# Patient Record
Sex: Female | Born: 1955
Health system: Southern US, Community
[De-identification: ages and names within clinical notes are randomized; demographics above are authoritative.]

## PROBLEM LIST (undated history)

## (undated) DIAGNOSIS — Z8619 Personal history of other infectious and parasitic diseases: Secondary | ICD-10-CM

## (undated) DIAGNOSIS — Z800 Family history of malignant neoplasm of digestive organs: Secondary | ICD-10-CM

## (undated) DIAGNOSIS — K219 Gastro-esophageal reflux disease without esophagitis: Secondary | ICD-10-CM

## (undated) DIAGNOSIS — I10 Essential (primary) hypertension: Secondary | ICD-10-CM

## (undated) DIAGNOSIS — Z803 Family history of malignant neoplasm of breast: Secondary | ICD-10-CM

## (undated) DIAGNOSIS — Z87828 Personal history of other (healed) physical injury and trauma: Secondary | ICD-10-CM

## (undated) DIAGNOSIS — R011 Cardiac murmur, unspecified: Secondary | ICD-10-CM

## (undated) DIAGNOSIS — T7840XA Allergy, unspecified, initial encounter: Secondary | ICD-10-CM

## (undated) HISTORY — PX: WISDOM TOOTH EXTRACTION: SHX21

## (undated) HISTORY — DX: Personal history of other (healed) physical injury and trauma: Z87.828

## (undated) HISTORY — PX: POLYPECTOMY: SHX149

## (undated) HISTORY — DX: Personal history of other infectious and parasitic diseases: Z86.19

## (undated) HISTORY — DX: Allergy, unspecified, initial encounter: T78.40XA

## (undated) HISTORY — DX: Gastro-esophageal reflux disease without esophagitis: K21.9

## (undated) HISTORY — DX: Essential (primary) hypertension: I10

## (undated) HISTORY — DX: Family history of malignant neoplasm of digestive organs: Z80.0

## (undated) HISTORY — DX: Family history of malignant neoplasm of breast: Z80.3

## (undated) HISTORY — PX: COLONOSCOPY: SHX174

## (undated) HISTORY — PX: BREAST SURGERY: SHX581

## (undated) HISTORY — DX: Cardiac murmur, unspecified: R01.1

---
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
## 1998-10-16 ENCOUNTER — Other Ambulatory Visit: Admission: RE | Admit: 1998-10-16 | Discharge: 1998-10-16 | Payer: Self-pay | Admitting: Gynecology

## 2001-07-06 ENCOUNTER — Encounter: Admission: RE | Admit: 2001-07-06 | Discharge: 2001-07-06 | Payer: Self-pay | Admitting: Gynecology

## 2001-07-06 ENCOUNTER — Encounter: Payer: Self-pay | Admitting: Gynecology

## 2001-07-10 ENCOUNTER — Other Ambulatory Visit: Admission: RE | Admit: 2001-07-10 | Discharge: 2001-07-10 | Payer: Self-pay | Admitting: Gynecology

## 2002-07-18 ENCOUNTER — Other Ambulatory Visit: Admission: RE | Admit: 2002-07-18 | Discharge: 2002-07-18 | Payer: Self-pay | Admitting: Gynecology

## 2002-07-19 ENCOUNTER — Encounter: Payer: Self-pay | Admitting: Gynecology

## 2002-07-19 ENCOUNTER — Ambulatory Visit (HOSPITAL_COMMUNITY): Admission: RE | Admit: 2002-07-19 | Discharge: 2002-07-19 | Payer: Self-pay | Admitting: Gynecology

## 2003-10-05 HISTORY — PX: KNEE ARTHROSCOPY WITH ANTERIOR CRUCIATE LIGAMENT (ACL) REPAIR: SHX5644

## 2004-11-16 ENCOUNTER — Ambulatory Visit (HOSPITAL_COMMUNITY): Admission: RE | Admit: 2004-11-16 | Discharge: 2004-11-16 | Payer: Self-pay | Admitting: Gynecology

## 2005-03-31 ENCOUNTER — Ambulatory Visit (HOSPITAL_COMMUNITY): Admission: RE | Admit: 2005-03-31 | Discharge: 2005-03-31 | Payer: Self-pay | Admitting: Orthopedic Surgery

## 2005-04-19 ENCOUNTER — Ambulatory Visit (HOSPITAL_BASED_OUTPATIENT_CLINIC_OR_DEPARTMENT_OTHER): Admission: RE | Admit: 2005-04-19 | Discharge: 2005-04-20 | Payer: Self-pay | Admitting: Orthopedic Surgery

## 2005-04-19 ENCOUNTER — Ambulatory Visit (HOSPITAL_COMMUNITY): Admission: RE | Admit: 2005-04-19 | Discharge: 2005-04-19 | Payer: Self-pay | Admitting: Orthopedic Surgery

## 2006-01-28 ENCOUNTER — Ambulatory Visit (HOSPITAL_COMMUNITY): Admission: RE | Admit: 2006-01-28 | Discharge: 2006-01-28 | Payer: Self-pay | Admitting: Gynecology

## 2006-02-02 ENCOUNTER — Encounter: Admission: RE | Admit: 2006-02-02 | Discharge: 2006-02-02 | Payer: Self-pay | Admitting: Gynecology

## 2006-02-17 ENCOUNTER — Encounter (INDEPENDENT_AMBULATORY_CARE_PROVIDER_SITE_OTHER): Payer: Self-pay | Admitting: *Deleted

## 2006-02-17 ENCOUNTER — Encounter: Admission: RE | Admit: 2006-02-17 | Discharge: 2006-02-17 | Payer: Self-pay | Admitting: Gynecology

## 2006-04-04 ENCOUNTER — Encounter: Admission: RE | Admit: 2006-04-04 | Discharge: 2006-04-04 | Payer: Self-pay | Admitting: Surgery

## 2006-04-04 ENCOUNTER — Ambulatory Visit (HOSPITAL_BASED_OUTPATIENT_CLINIC_OR_DEPARTMENT_OTHER): Admission: RE | Admit: 2006-04-04 | Discharge: 2006-04-04 | Payer: Self-pay | Admitting: Surgery

## 2006-04-04 ENCOUNTER — Encounter (INDEPENDENT_AMBULATORY_CARE_PROVIDER_SITE_OTHER): Payer: Self-pay | Admitting: Specialist

## 2006-08-22 ENCOUNTER — Other Ambulatory Visit: Admission: RE | Admit: 2006-08-22 | Discharge: 2006-08-22 | Payer: Self-pay | Admitting: Gynecology

## 2006-10-04 HISTORY — PX: BREAST EXCISIONAL BIOPSY: SUR124

## 2006-11-09 IMAGING — MG MM BREAST STEREO BIOPSY*L*
3 series · 3 of 3 positions shown · non-contrast
Comparison: none

MAMMO BREAST SURGICAL SPECIMEN

STEREO BIOPSY *L* BREAST
STEREOTACTIC BIOPSY, LEFT AND SURGICAL SPECIMEN:
CLINICAL DATA: 49-year-old with calcifications left breast.  The patient presents for biopsy.

[L CC (1 of 2)]
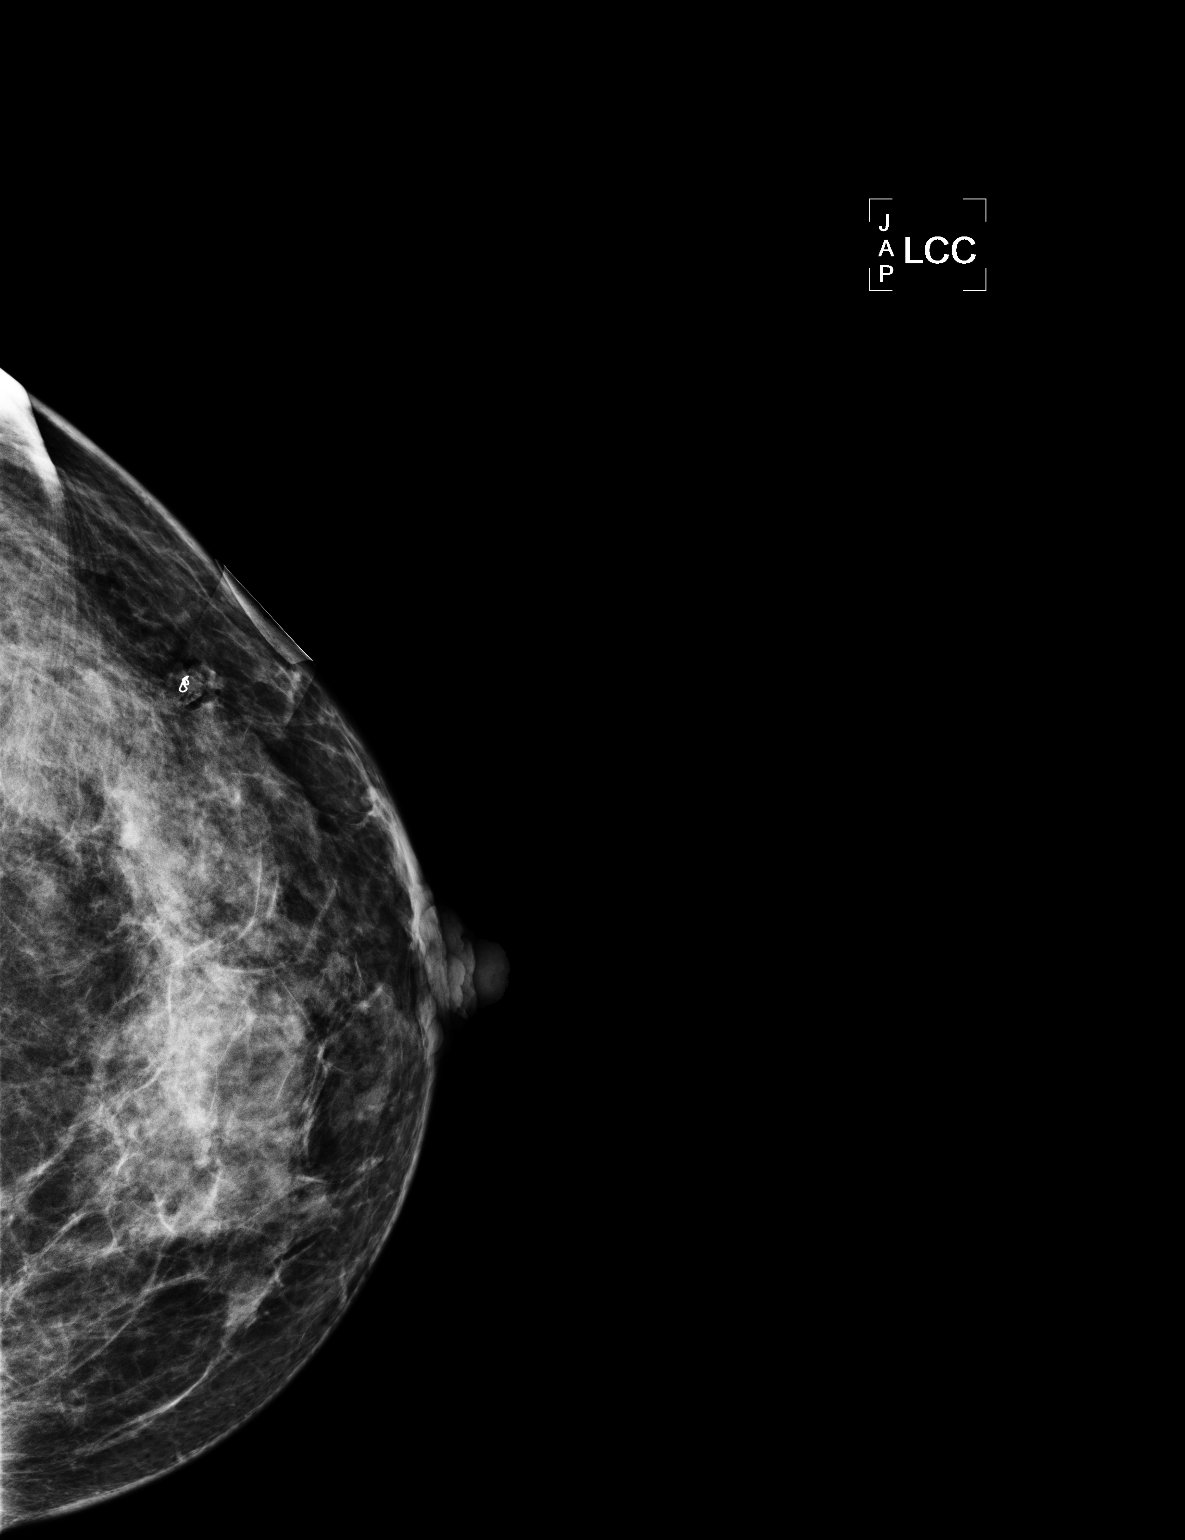

[L CC (2 of 2)]
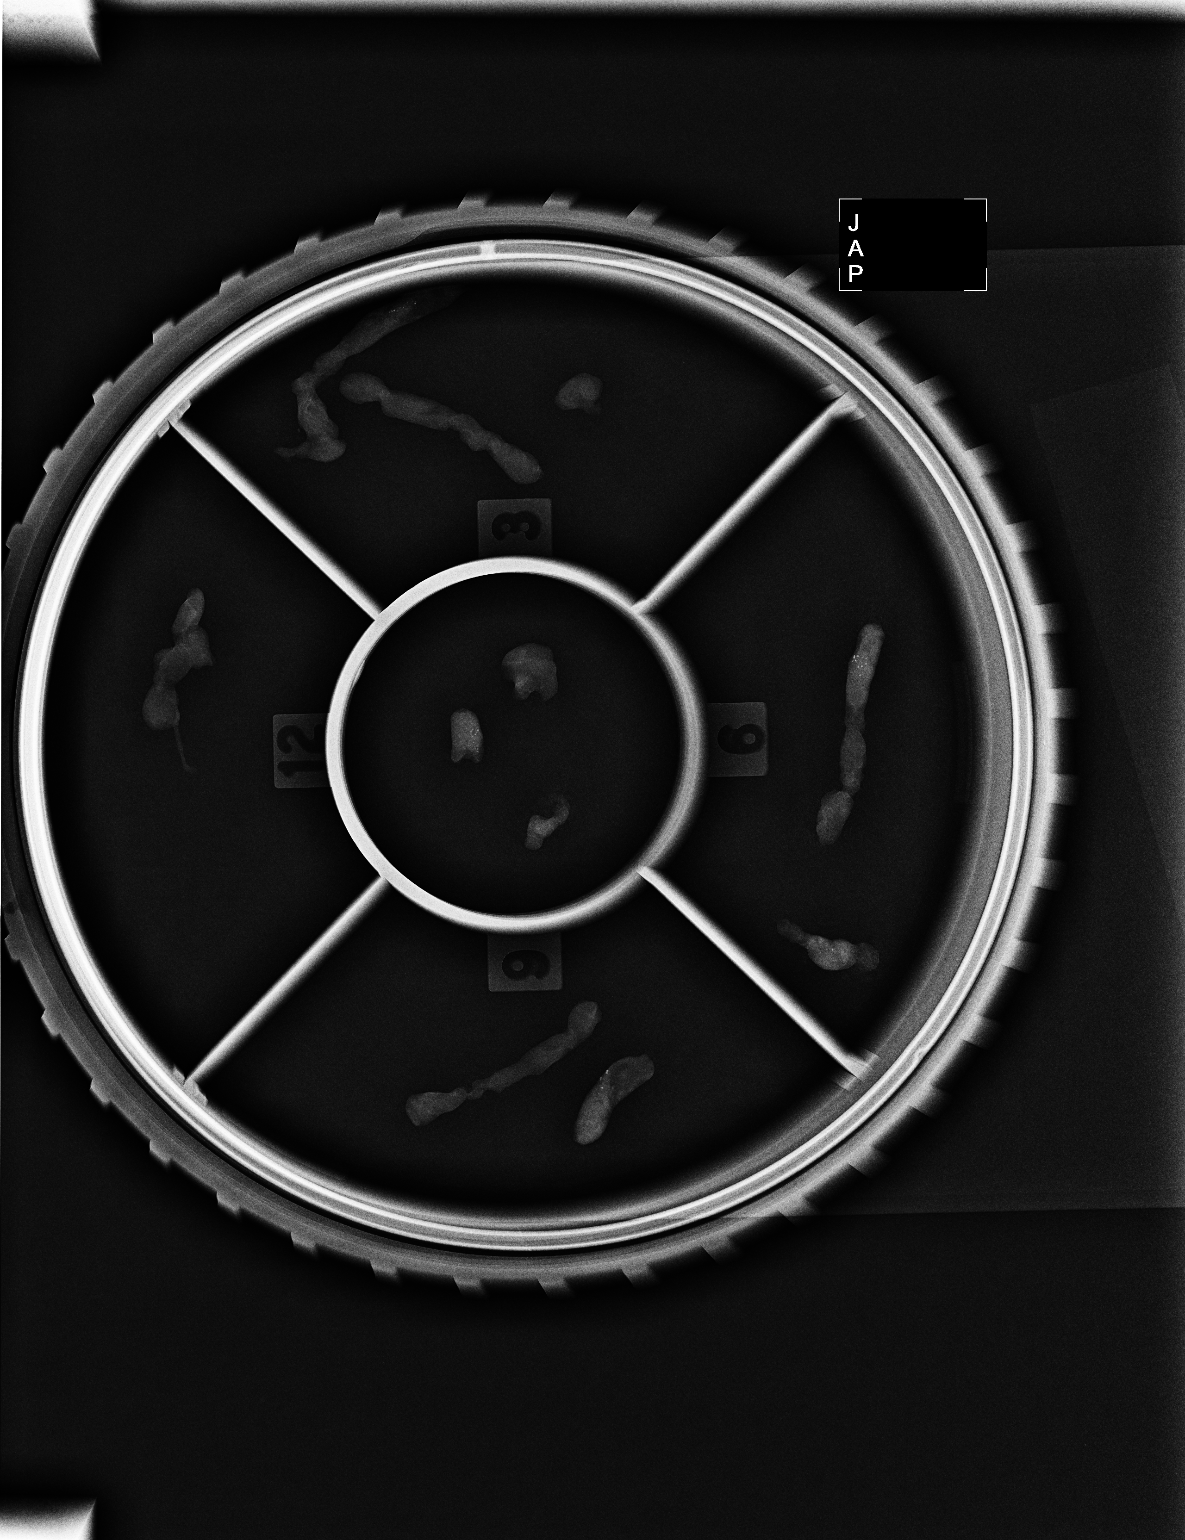

[L ML]
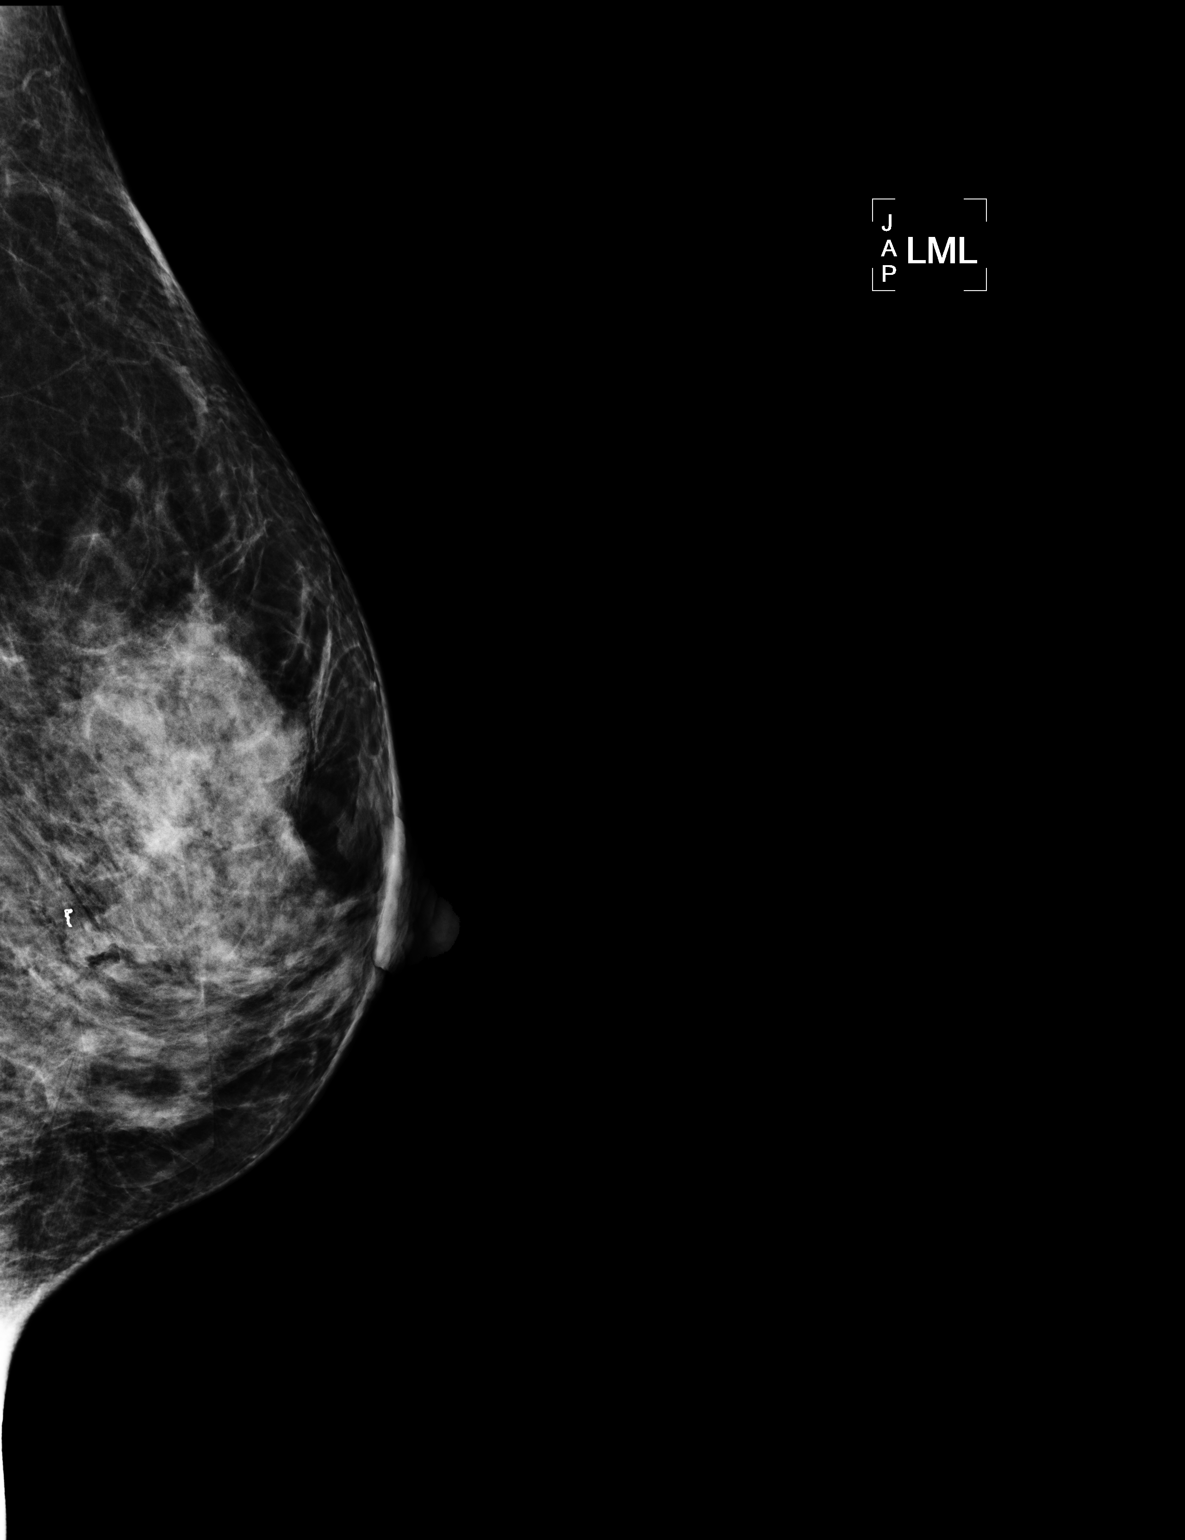

[3 of 3 positions shown; findings below may reference images not displayed]

I met with the patient and we discussed the procedure of stereotactic-guided biopsy including 
risks, benefits, and alternatives.  Informed written consent was given.

Using stereotactic guidance, sterile technique, 2% Lidocaine, and an 11-gauge Mammotome needle, 
biopsy was performed of calcifications in the lateral portion of the left breast from a caudal 
approach as the calcifications were not seen from the lateral approach.

Specimen radiograph confirms the calcifications to be present and these are marked for pathology.  
A MammoMark clip was deployed at the biopsy site.  Follow-up two view mammogram shows clip in the 
region of a few residual calcifications.
IMPRESSION: Stereotactic-guided core biopsy without apparent complications.  Pathology is pending.

PATHOLOGY RESULTS: HIGH RISK
High risk atypical lobular hyperplasia.
Benign columnar alteration w/prominent apical snouts.

Pathology shows atypical lobular hyperplasia with calcifications and CAPSS.  Excision is suggested.
I discussed the findings with the patient on the phone.  She reports doing well with only minimal
soreness at the biopsy site. Surgical consultation will be arranged for the patient with Dr. 
Rugama.

Surgical consultation of the left breast.
,

## 2007-02-24 ENCOUNTER — Encounter: Admission: RE | Admit: 2007-02-24 | Discharge: 2007-02-24 | Payer: Self-pay | Admitting: Gynecology

## 2008-03-01 ENCOUNTER — Ambulatory Visit (HOSPITAL_COMMUNITY): Admission: RE | Admit: 2008-03-01 | Discharge: 2008-03-01 | Payer: Self-pay | Admitting: Gynecology

## 2008-03-12 ENCOUNTER — Encounter: Admission: RE | Admit: 2008-03-12 | Discharge: 2008-03-12 | Payer: Self-pay | Admitting: Gynecology

## 2009-03-06 ENCOUNTER — Ambulatory Visit (HOSPITAL_COMMUNITY): Admission: RE | Admit: 2009-03-06 | Discharge: 2009-03-06 | Payer: Self-pay | Admitting: Gynecology

## 2009-08-01 ENCOUNTER — Ambulatory Visit: Payer: Self-pay | Admitting: Gastroenterology

## 2009-08-13 ENCOUNTER — Encounter: Payer: Self-pay | Admitting: Gastroenterology

## 2009-08-13 ENCOUNTER — Ambulatory Visit: Payer: Self-pay | Admitting: Gastroenterology

## 2009-08-15 ENCOUNTER — Encounter: Payer: Self-pay | Admitting: Gastroenterology

## 2010-03-10 ENCOUNTER — Ambulatory Visit (HOSPITAL_COMMUNITY): Admission: RE | Admit: 2010-03-10 | Discharge: 2010-03-10 | Payer: Self-pay | Admitting: Gynecology

## 2010-10-25 ENCOUNTER — Encounter: Payer: Self-pay | Admitting: Gynecology

## 2011-02-19 NOTE — Op Note (Signed)
Kathy Shaw, HOLQUIN                ACCOUNT NO.:  [id number]   MEDICAL RECORD NO.:  [id number]          PATIENT TYPE:  AMB   LOCATION:  DSC                          FACILITY:  [facility]   PHYSICIAN:  Currie Paris, M.D.DATE OF BIRTH:  May 10, 1956   DATE OF PROCEDURE:  04/04/2006  DATE OF DISCHARGE:                                 OPERATIVE REPORT   PREOPERATIVE DIAGNOSIS:  Atypical lobular hyperplasia left breast.   POSTOPERATIVE DIAGNOSIS:  Atypical lobular hyperplasia left breast.   OPERATION:  Needle-guided excision area of atypical lobular hyperplasia left  breast.   SURGEON:  Currie Paris, M.D.   ANESTHESIA:  MAC.   CLINICAL HISTORY:  Ms. Hoch has a recent area of abnormality seen on  mammogram, and a core biopsy showed CAPPS  and atypical lobular hyperplasia.  It was recommended that she undergo a wider surgical excision to be sure  there is no underlying malignancy.   DESCRIPTION OF PROCEDURE:  The patient was seen in the holding area.  She  had no further questions and confirmed the left side as the operative side.  She already had a guidewire placed, and I was able to review those films.  The left breast was marked as the operative side as well.   The patient was taken to the operating room, and after satisfactory IV  sedation the left breast was prepped and draped.  The time-out occurred.   The guidewire entered somewhat laterally and tracked medially towards the  nipple-areolar complex.  It entered almost in the exact 3 o'clock position.   I infiltrated the area with combination 1% plain Xylocaine and 0.5% Marcaine  with epinephrine mixed equally.  I made a horizontal incision directly over  the guide wire.  The guidewire entered just above her prior biopsy scar, and  there was a little scarring of the subcutaneous, which I excised to make  sure we took this with the tissue.   The calcifications and the marking clip appeared to be fairly superficial,  so I raised a little bit of skin flap superiorly and inferiorly.  I then  grasped the area around the guidewire with an Allis clamp and, using  traction to pull it up into the wound, basically took a cylinder of tissue  around the guidewire, including all of the subcutaneous tissue, so that I  extended until I was beyond the tip of the guidewire.  I thought this gave  me a wide area around it.   I made sure everything was dry and used cautery to achieve this.  I  infiltrated additional local into the deeper tissues and in the more  surrounding skin to be sure we had good postoperative analgesia as well.   Once everything was dry, I  closed in layers with 3-0 Vicryl followed by 4-0  Monocryl subcuticular plus Dermabond on the skin.     Radiology reported that the clip and area in question were seen in the  specimen mammogram.  Sterile dressings were applied.  The patient tolerated  the procedure well.  There were no operative complications.  All counts were  correct.      Currie Paris, M.D.  Electronically Signed     CJS/MEDQ  D:  04/04/2006  T:  04/04/2006  Job:  [id number]   cc:   Gretta Cool, M.D.  Fax: [phone]

## 2011-02-19 NOTE — Op Note (Signed)
Kathy Shaw, Kathy Shaw                ACCOUNT NO.:  [id number]   MEDICAL RECORD NO.:  [id number]          PATIENT TYPE:  AMB   LOCATION:  DSC                          FACILITY:  [facility]   PHYSICIAN:  Vania Rea. Supple, M.D.  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   POSTOPERATIVE DIAGNOSES:  1.  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  2.  Chondromalacia of the patella.  3.  Lateral meniscus tear.   PROCEDURES:  1.  Left knee examination under anesthesia.  2.  Left knee diagnostic arthroscopy.  3.  Allograft ACL reconstruction utilizing a bone patella to bone allograft.  4.  Chondroplasty of the patella.  5.  Partial lateral meniscectomy.   SURGEON:  Vania Rea. Supple, MD.   Threasa HeadsLucita Lora. Shuford, PA-C.   ANESTHESIA:  LMA general as well as a femoral nerve block.   ESTIMATED BLOOD LOSS:  Minimal.   TOURNIQUET TIME:  None was used.   HISTORY:  Ms. Vawter is a 55 year old female, who injured her left knee  playing kick ball approximately one month ago and felt a pop in her knee  with subsequent swelling.  Subsequently, she has developed sensations of  instability in the knee with an examination confirming a positive block and  a pivot shift.  MRI scan confirms a complete rupture of the ACL and  characteristic bone bruising patterns of the femoral condyles and tibial  plateau.  Due to her ongoing pain and symptomatic instability, she is  brought to the operating room at this time for a planned left knee  arthroscopy as described below.   Preoperatively, we had a discussion of the treatment options as well as  risks and benefits thereof.  The possible surgical complications of  bleeding, infection, neurovascular injury, DVT, PE,  arthrofibrosis,  persistence of pain, loss of motion, recurrence of instability, and possible  need for additional surgery were all reviewed.  She understands and accepts  and agrees to the planned procedure.   PROCEDURE IN DETAIL:  After undergoing routine preoperative evaluation, the  patient received prophylactic antibiotics.  Femoral nerve block established  in the holding area by the Anesthesia Department.  Placed supine on the  operating table and underwent smooth induction of LMA general anesthesia.  Tourniquet applied to the left thigh, and the left leg was sterilely prepped  and draped in a standard fashion.  On a back table, the graft was thawed and  trimmed to fit through 10-mm tunnels with sutures passed through the bone  plugs proximally and distally.  At this point, we then established  __________arthroscopy portals and diagnostic arthroscopy was performed.  The  suprapatella pouch and gutters were clear.  The patellofemoral joint showed  an area of grade 3 chondromalacia over the central patellar facet and this  was debrided with a shaver to a stable cartilaginous base.  There was normal  patellar tracking.  The intercondylar notch showed the ACL had avulsed from  its femoral origin and had obvious laxity.  The remnant of the ACL was  removed with the shaver.  The PCL was intact.  Medially, the meniscus was  probed and found to be stable, and the cartilage surfaces were in excellent  condition medially.  Laterally as well, the articular surfaces were in  excellent condition, and the meniscus was probed and found to be stable with  the exception of a small tear of the mid-third of the lateral meniscus,  which was gently debrided with a shaver.  At this point, our attention was  then returned to the intercondylar notch where an osteotome and gouge were  then used to initiate the notchplasty and then the bone fragments were  removed with a rongeur, and then a bur was used to  complete the notchplasty  back to the over-the-top position.  All residual bony debris and soft tissue  in the posterior aspect of the notch were removed with a shaver.  A tibial  guide was then passed and, based off the tibial guide, a 3-cm incision was  then made over the proximal-medial aspect of the tibia, sharp dissection  carried down through skin and subcutaneous tissue, and the periosteum then  divided.  A guide pin was then directed up through the footprint of the ACL,  and this was then over-drilled with a 10-mm reamer.  A [place] guide was  then passed and used to place a guide pin at the 1 o'clock position at the  posterior aspect of the notch and this was then drilled to the appropriate  depth and over-drilled with a 10-mm reamer, care taken to make sure that the  femoral bone tunnel had appropriate thickness of the posterior wall.  At  this time, a shaver was then used to remove all residual bony debris and  soft tissue at the orifices of the tunnels.  A two-pin passer was directed  through the tibial and then the femoral tunnel and brought out through a  stab wound on the anterolateral thigh.  A guidewire was passed over the two-  pin passer and then the graft was pulled into position, obtaining excellent  bony interference fit.  An 8 x 25 mm biointerference screw was then directed  over the guidewire up into the femoral tunnel, obtaining excellent bony  interference fit.  The knee was then taken through a range of motion and  based on this there was evidence for impingement to full extension, so an  additional notchplasty was then performed with a bur and once this was  completed then there was no evidence for impingement to full extension, and  the graft did show physiometric positioning.  We did notice, however, that  the distal bone plug was significantly prominent.  We did try to place a biointerference screw, but this did not achieve adequate purchase.  With  this in  mind, we utilized the bur to create a bony trough along the proximal  tibia to accommodate the bone plug and then we used two Arthrex staples to  staple the graft into position, taking care to assure proper tensioning on  the graft and then, once the staples were properly recessed and impacted, we  also used a post utilizing an Arthrex self-tapping screw to further  reinforce the fixation of the tibial plug.  At this point, the  intraoperative Lachman was negative.  The  graft was inspected and probed and  found to have good stability.  At this point, all wounds were irrigated,  fluid and instruments were removed.  The anterior incision was closed with 2-  0 Vicryl and 3-0 Monocryl intracuticular for the skin followed by Steri-  Strips.  A combination of Marcaine, Morphine, and Epi was instilled in the  knee, and all portals were closed with Steri-Strips.  A bulky, dry dressing  was wrapped about the right knee.  The leg was wrapped with an Ace bandage  and the thigh in a support stocking.  A knee immobilizer was placed.  The  patient was then extubated and taken to the recovery room in stable  condition.       KMS/MEDQ  D:  04/19/2005  T:  04/19/2005  Job:  [id number]

## 2011-03-10 ENCOUNTER — Other Ambulatory Visit (HOSPITAL_COMMUNITY): Payer: Self-pay | Admitting: Gynecology

## 2011-03-10 DIAGNOSIS — Z1231 Encounter for screening mammogram for malignant neoplasm of breast: Secondary | ICD-10-CM

## 2011-03-24 ENCOUNTER — Ambulatory Visit (HOSPITAL_COMMUNITY)
Admission: RE | Admit: 2011-03-24 | Discharge: 2011-03-24 | Disposition: A | Discharge status: Home / Self Care | Payer: Commercial Managed Care - PPO | Source: Ambulatory Visit | Attending: Gynecology | Admitting: Gynecology

## 2011-03-24 DIAGNOSIS — Z1231 Encounter for screening mammogram for malignant neoplasm of breast: Secondary | ICD-10-CM | POA: Insufficient documentation

## 2012-04-10 ENCOUNTER — Other Ambulatory Visit (HOSPITAL_COMMUNITY): Payer: Self-pay | Admitting: Gynecology

## 2012-04-10 DIAGNOSIS — Z1231 Encounter for screening mammogram for malignant neoplasm of breast: Secondary | ICD-10-CM

## 2012-05-03 ENCOUNTER — Ambulatory Visit (HOSPITAL_COMMUNITY)
Admission: RE | Admit: 2012-05-03 | Discharge: 2012-05-03 | Disposition: A | Discharge status: Home / Self Care | Payer: 59 | Source: Ambulatory Visit | Attending: Gynecology | Admitting: Gynecology

## 2012-05-03 DIAGNOSIS — Z1231 Encounter for screening mammogram for malignant neoplasm of breast: Secondary | ICD-10-CM | POA: Insufficient documentation

## 2012-10-25 ENCOUNTER — Other Ambulatory Visit: Payer: Self-pay | Admitting: Gynecology

## 2013-06-14 ENCOUNTER — Other Ambulatory Visit (HOSPITAL_COMMUNITY): Payer: Self-pay | Admitting: Gynecology

## 2013-06-14 DIAGNOSIS — M899 Disorder of bone, unspecified: Secondary | ICD-10-CM

## 2013-06-14 DIAGNOSIS — Z1231 Encounter for screening mammogram for malignant neoplasm of breast: Secondary | ICD-10-CM

## 2013-06-20 ENCOUNTER — Ambulatory Visit (HOSPITAL_COMMUNITY)
Admission: RE | Admit: 2013-06-20 | Discharge: 2013-06-20 | Disposition: A | Discharge status: Home / Self Care | Payer: 59 | Source: Ambulatory Visit | Attending: Gynecology | Admitting: Gynecology

## 2013-06-20 DIAGNOSIS — Z13820 Encounter for screening for osteoporosis: Secondary | ICD-10-CM | POA: Insufficient documentation

## 2013-06-20 DIAGNOSIS — Z780 Asymptomatic menopausal state: Secondary | ICD-10-CM | POA: Insufficient documentation

## 2013-06-20 DIAGNOSIS — Z1231 Encounter for screening mammogram for malignant neoplasm of breast: Secondary | ICD-10-CM | POA: Insufficient documentation

## 2013-06-20 DIAGNOSIS — M899 Disorder of bone, unspecified: Secondary | ICD-10-CM

## 2014-05-09 ENCOUNTER — Encounter: Payer: Self-pay | Admitting: Gastroenterology

## 2014-05-28 ENCOUNTER — Other Ambulatory Visit (HOSPITAL_COMMUNITY): Payer: Self-pay | Admitting: Gynecology

## 2014-05-28 DIAGNOSIS — Z1231 Encounter for screening mammogram for malignant neoplasm of breast: Secondary | ICD-10-CM

## 2014-06-05 ENCOUNTER — Encounter: Payer: Self-pay | Admitting: Gastroenterology

## 2014-06-27 ENCOUNTER — Ambulatory Visit (HOSPITAL_COMMUNITY)
Admission: RE | Admit: 2014-06-27 | Discharge: 2014-06-27 | Disposition: A | Discharge status: Home / Self Care | Payer: 59 | Source: Ambulatory Visit | Attending: Gynecology | Admitting: Gynecology

## 2014-06-27 DIAGNOSIS — Z1231 Encounter for screening mammogram for malignant neoplasm of breast: Secondary | ICD-10-CM | POA: Insufficient documentation

## 2015-01-06 ENCOUNTER — Encounter: Payer: Self-pay | Admitting: Gastroenterology

## 2015-07-22 ENCOUNTER — Other Ambulatory Visit: Payer: Self-pay

## 2015-07-22 DIAGNOSIS — Z1231 Encounter for screening mammogram for malignant neoplasm of breast: Secondary | ICD-10-CM

## 2015-08-22 ENCOUNTER — Ambulatory Visit
Admission: RE | Admit: 2015-08-22 | Discharge: 2015-08-22 | Disposition: A | Discharge status: Home / Self Care | Payer: 59 | Source: Ambulatory Visit

## 2015-08-22 DIAGNOSIS — Z1231 Encounter for screening mammogram for malignant neoplasm of breast: Secondary | ICD-10-CM

## 2015-08-26 ENCOUNTER — Other Ambulatory Visit: Payer: Self-pay | Admitting: Gynecology

## 2015-08-26 DIAGNOSIS — R928 Other abnormal and inconclusive findings on diagnostic imaging of breast: Secondary | ICD-10-CM

## 2015-09-03 ENCOUNTER — Ambulatory Visit
Admission: RE | Admit: 2015-09-03 | Discharge: 2015-09-03 | Disposition: A | Discharge status: Home / Self Care | Payer: 59 | Source: Ambulatory Visit | Attending: Gynecology | Admitting: Gynecology

## 2015-09-03 DIAGNOSIS — R928 Other abnormal and inconclusive findings on diagnostic imaging of breast: Secondary | ICD-10-CM

## 2016-08-06 ENCOUNTER — Other Ambulatory Visit: Payer: Self-pay | Admitting: Gynecology

## 2016-08-06 DIAGNOSIS — Z1231 Encounter for screening mammogram for malignant neoplasm of breast: Secondary | ICD-10-CM

## 2016-09-13 ENCOUNTER — Ambulatory Visit
Admission: RE | Admit: 2016-09-13 | Discharge: 2016-09-13 | Disposition: A | Discharge status: Home / Self Care | Payer: 59 | Source: Ambulatory Visit | Attending: Gynecology | Admitting: Gynecology

## 2016-09-13 DIAGNOSIS — Z1231 Encounter for screening mammogram for malignant neoplasm of breast: Secondary | ICD-10-CM

## 2017-09-13 ENCOUNTER — Other Ambulatory Visit: Payer: Self-pay | Admitting: Gynecology

## 2017-09-13 DIAGNOSIS — Z1231 Encounter for screening mammogram for malignant neoplasm of breast: Secondary | ICD-10-CM

## 2017-09-14 ENCOUNTER — Ambulatory Visit: Payer: 59 | Admitting: Family Medicine

## 2017-09-14 ENCOUNTER — Ambulatory Visit
Admission: RE | Admit: 2017-09-14 | Discharge: 2017-09-14 | Disposition: A | Discharge status: Home / Self Care | Payer: 59 | Source: Ambulatory Visit | Attending: Gynecology | Admitting: Gynecology

## 2017-09-14 DIAGNOSIS — Z1231 Encounter for screening mammogram for malignant neoplasm of breast: Secondary | ICD-10-CM | POA: Diagnosis not present

## 2017-09-16 ENCOUNTER — Ambulatory Visit (INDEPENDENT_AMBULATORY_CARE_PROVIDER_SITE_OTHER): Payer: 59 | Admitting: Family Medicine

## 2017-09-16 ENCOUNTER — Other Ambulatory Visit (HOSPITAL_COMMUNITY)
Admission: RE | Admit: 2017-09-16 | Discharge: 2017-09-16 | Disposition: A | Discharge status: Home / Self Care | Payer: 59 | Source: Ambulatory Visit | Attending: Family Medicine | Admitting: Family Medicine

## 2017-09-16 ENCOUNTER — Encounter: Payer: Self-pay | Admitting: Family Medicine

## 2017-09-16 VITALS — BP 136/90 | HR 101 | Temp 98.6°F | Ht 63.75 in | Wt 171.2 lb

## 2017-09-16 DIAGNOSIS — Z808 Family history of malignant neoplasm of other organs or systems: Secondary | ICD-10-CM

## 2017-09-16 DIAGNOSIS — Z1159 Encounter for screening for other viral diseases: Secondary | ICD-10-CM | POA: Diagnosis not present

## 2017-09-16 DIAGNOSIS — Z803 Family history of malignant neoplasm of breast: Secondary | ICD-10-CM | POA: Diagnosis not present

## 2017-09-16 DIAGNOSIS — Z800 Family history of malignant neoplasm of digestive organs: Secondary | ICD-10-CM | POA: Insufficient documentation

## 2017-09-16 DIAGNOSIS — Z0000 Encounter for general adult medical examination without abnormal findings: Secondary | ICD-10-CM

## 2017-09-16 DIAGNOSIS — Z124 Encounter for screening for malignant neoplasm of cervix: Secondary | ICD-10-CM

## 2017-09-16 DIAGNOSIS — R030 Elevated blood-pressure reading, without diagnosis of hypertension: Secondary | ICD-10-CM | POA: Insufficient documentation

## 2017-09-16 HISTORY — DX: Family history of malignant neoplasm of digestive organs: Z80.0

## 2017-09-16 LAB — LIPID PANEL
Cholesterol: 214 mg/dL — ABNORMAL HIGH (ref 0–200)
HDL: 112.1 mg/dL (ref >39.00)
LDL Cholesterol: 82 mg/dL (ref 0–99)
NONHDL: 101.46
Total CHOL/HDL Ratio: 2
Triglycerides: 97 mg/dL (ref 0.0–149.0)
VLDL: 19.4 mg/dL (ref 0.0–40.0)

## 2017-09-16 LAB — CBC WITH DIFFERENTIAL/PLATELET
BASOS ABS: 0 10*3/uL (ref 0.0–0.1)
Basophils Relative: 0.6 % (ref 0.0–3.0)
Eosinophils Absolute: 0.1 10*3/uL (ref 0.0–0.7)
Eosinophils Relative: 1.4 % (ref 0.0–5.0)
HEMATOCRIT: 40 % (ref 36.0–46.0)
HEMOGLOBIN: 13.5 g/dL (ref 12.0–15.0)
Lymphocytes Relative: 26.2 % (ref 12.0–46.0)
Lymphs Abs: 1.6 10*3/uL (ref 0.7–4.0)
MCHC: 33.7 g/dL (ref 30.0–36.0)
MCV: 96.2 fl (ref 78.0–100.0)
MONOS PCT: 8.4 % (ref 3.0–12.0)
Monocytes Absolute: 0.5 10*3/uL (ref 0.1–1.0)
NEUTROS PCT: 63.4 % (ref 43.0–77.0)
Neutro Abs: 3.9 10*3/uL (ref 1.4–7.7)
Platelets: 330 10*3/uL (ref 150.0–400.0)
RBC: 4.16 Mil/uL (ref 3.87–5.11)
RDW: 13.5 % (ref 11.5–15.5)
WBC: 6.1 10*3/uL (ref 4.0–10.5)

## 2017-09-16 LAB — COMPREHENSIVE METABOLIC PANEL
ALBUMIN: 4.5 g/dL (ref 3.5–5.2)
ALK PHOS: 44 U/L (ref 39–117)
ALT: 17 U/L (ref 0–35)
AST: 14 U/L (ref 0–37)
BILIRUBIN TOTAL: 0.5 mg/dL (ref 0.2–1.2)
BUN: 12 mg/dL (ref 6–23)
CALCIUM: 9.5 mg/dL (ref 8.4–10.5)
CO2: 31 mEq/L (ref 19–32)
Chloride: 100 mEq/L (ref 96–112)
Creatinine, Ser: 0.81 mg/dL (ref 0.40–1.20)
GFR: 76.34 mL/min (ref >60.00)
GLUCOSE: 101 mg/dL — AB (ref 70–99)
Potassium: 4.4 mEq/L (ref 3.5–5.1)
Sodium: 139 mEq/L (ref 135–145)
TOTAL PROTEIN: 6.8 g/dL (ref 6.0–8.3)

## 2017-09-16 NOTE — Patient Instructions (Addendum)
Please return in 12 months for your physical; 3 months IF you find that your blood pressure is remaining > or = 140/90 when you check outside of the office.  Eat less salt and try to lose 3-6 pounds in the next 3 months.  Merry Christmas!  It was a pleasure meeting you! Thank you for choosing [place] to meet your healthcare needs! I truly look forward to working with you.  Health Maintenance, Female Adopting a healthy lifestyle and getting preventive care can go a long way to promote health and wellness. Talk with your health care provider about what schedule of regular examinations is right for you. This is a good chance for you to check in with your provider about disease prevention and staying healthy. In between checkups, there are plenty of things you can do on your own. Experts have done a lot of research about which lifestyle changes and preventive measures are most likely to keep you healthy. Ask your health care provider for more information. Weight and diet Eat a healthy diet  Be sure to include plenty of vegetables, fruits, low-fat dairy products, and lean protein.  Do not eat a lot of foods high in solid fats, added sugars, or salt.  Get regular exercise. This is one of the most important things you can do for your health. ? Most adults should exercise for at least 150 minutes each week. The exercise should increase your heart rate and make you sweat (moderate-intensity exercise). ? Most adults should also do strengthening exercises at least twice a week. This is in addition to the moderate-intensity exercise.  Maintain a healthy weight  Body mass index (BMI) is a measurement that can be used to identify possible weight problems. It estimates body fat based on height and weight. Your health care provider can help determine your BMI and help you achieve or maintain a healthy weight.  For females 61 years of age and older: ? A BMI below 18.5 is considered underweight. ? A BMI of 18.5  to 24.9 is normal. ? A BMI of 25 to 29.9 is considered overweight. ? A BMI of 30 and above is considered obese.  Watch levels of cholesterol and blood lipids  You should start having your blood tested for lipids and cholesterol at 61 years of age, then have this test every 5 years.  You may need to have your cholesterol levels checked more often if: ? Your lipid or cholesterol levels are high. ? You are older than 61 years of age. ? You are at high risk for heart disease.  Cancer screening Lung Cancer  Lung cancer screening is recommended for adults 61-68 years old who are at high risk for lung cancer because of a history of smoking.  A yearly low-dose CT scan of the lungs is recommended for people who: ? Currently smoke. ? Have quit within the past 15 years. ? Have at least a 30-pack-year history of smoking. A pack year is smoking an average of one pack of cigarettes a day for 1 year.  Yearly screening should continue until it has been 15 years since you quit.  Yearly screening should stop if you develop a health problem that would prevent you from having lung cancer treatment.  Breast Cancer  Practice breast self-awareness. This means understanding how your breasts normally appear and feel.  It also means doing regular breast self-exams. Let your health care provider know about any changes, no matter how small.  If you are in  your 20s or 30s, you should have a clinical breast exam (CBE) by a health care provider every 1-3 years as part of a regular health exam.  If you are 61 or older, have a CBE every year. Also consider having a breast X-ray (mammogram) every year.  If you have a family history of breast cancer, talk to your health care provider about genetic screening.  If you are at high risk for breast cancer, talk to your health care provider about having an MRI and a mammogram every year.  Breast cancer gene (BRCA) assessment is recommended for women who have family  members with BRCA-related cancers. BRCA-related cancers include: ? Breast. ? Ovarian. ? Tubal. ? Peritoneal cancers.  Results of the assessment will determine the need for genetic counseling and BRCA1 and BRCA2 testing.  Cervical Cancer Your health care provider may recommend that you be screened regularly for cancer of the pelvic organs (ovaries, uterus, and vagina). This screening involves a pelvic examination, including checking for microscopic changes to the surface of your cervix (Pap test). You may be encouraged to have this screening done every 3 years, beginning at age 61.  For women ages 4-65, health care providers may recommend pelvic exams and Pap testing every 3 years, or they may recommend the Pap and pelvic exam, combined with testing for human papilloma virus (HPV), every 5 years. Some types of HPV increase your risk of cervical cancer. Testing for HPV may also be done on women of any age with unclear Pap test results.  Other health care providers may not recommend any screening for nonpregnant women who are considered low risk for pelvic cancer and who do not have symptoms. Ask your health care provider if a screening pelvic exam is right for you.  If you have had past treatment for cervical cancer or a condition that could lead to cancer, you need Pap tests and screening for cancer for at least 20 years after your treatment. If Pap tests have been discontinued, your risk factors (such as having a new sexual partner) need to be reassessed to determine if screening should resume. Some women have medical problems that increase the chance of getting cervical cancer. In these cases, your health care provider may recommend more frequent screening and Pap tests.  Colorectal Cancer  This type of cancer can be detected and often prevented.  Routine colorectal cancer screening usually begins at 61 years of age and continues through 61 years of age.  Your health care provider may  recommend screening at an earlier age if you have risk factors for colon cancer.  Your health care provider may also recommend using home test kits to check for hidden blood in the stool.  A small camera at the end of a tube can be used to examine your colon directly (sigmoidoscopy or colonoscopy). This is done to check for the earliest forms of colorectal cancer.  Routine screening usually begins at age 60.  Direct examination of the colon should be repeated every 5-10 years through 61 years of age. However, you may need to be screened more often if early forms of precancerous polyps or small growths are found.  Skin Cancer  Check your skin from head to toe regularly.  Tell your health care provider about any new moles or changes in moles, especially if there is a change in a mole's shape or color.  Also tell your health care provider if you have a mole that is larger than the size  of a pencil eraser.  Always use sunscreen. Apply sunscreen liberally and repeatedly throughout the day.  Protect yourself by wearing long sleeves, pants, a wide-brimmed hat, and sunglasses whenever you are outside.  Heart disease, diabetes, and high blood pressure  High blood pressure causes heart disease and increases the risk of stroke. High blood pressure is more likely to develop in: ? People who have blood pressure in the high end of the normal range (130-139/85-89 mm Hg). ? People who are overweight or obese. ? People who are African American.  If you are 45-38 years of age, have your blood pressure checked every 3-5 years. If you are 33 years of age or older, have your blood pressure checked every year. You should have your blood pressure measured twice-once when you are at a hospital or clinic, and once when you are not at a hospital or clinic. Record the average of the two measurements. To check your blood pressure when you are not at a hospital or clinic, you can use: ? An automated blood pressure  machine at a pharmacy. ? A home blood pressure monitor.  If you are between 53 years and 40 years old, ask your health care provider if you should take aspirin to prevent strokes.  Have regular diabetes screenings. This involves taking a blood sample to check your fasting blood sugar level. ? If you are at a normal weight and have a low risk for diabetes, have this test once every three years after 61 years of age. ? If you are overweight and have a high risk for diabetes, consider being tested at a younger age or more often. Preventing infection Hepatitis B  If you have a higher risk for hepatitis B, you should be screened for this virus. You are considered at high risk for hepatitis B if: ? You were born in a country where hepatitis B is common. Ask your health care provider which countries are considered high risk. ? Your parents were born in a high-risk country, and you have not been immunized against hepatitis B (hepatitis B vaccine). ? You have HIV or AIDS. ? You use needles to inject street drugs. ? You live with someone who has hepatitis B. ? You have had sex with someone who has hepatitis B. ? You get hemodialysis treatment. ? You take certain medicines for conditions, including cancer, organ transplantation, and autoimmune conditions.  Hepatitis C  Blood testing is recommended for: ? Everyone born from [birth year] through 1965. ? Anyone with known risk factors for hepatitis C.  Sexually transmitted infections (STIs)  You should be screened for sexually transmitted infections (STIs) including gonorrhea and chlamydia if: ? You are sexually active and are younger than 61 years of age. ? You are older than 61 years of age and your health care provider tells you that you are at risk for this type of infection. ? Your sexual activity has changed since you were last screened and you are at an increased risk for chlamydia or gonorrhea. Ask your health care provider if you are at  risk.  If you do not have HIV, but are at risk, it may be recommended that you take a prescription medicine daily to prevent HIV infection. This is called pre-exposure prophylaxis (PrEP). You are considered at risk if: ? You are sexually active and do not regularly use condoms or know the HIV status of your partner(s). ? You take drugs by injection. ? You are sexually active with a partner who  has HIV.  Talk with your health care provider about whether you are at high risk of being infected with HIV. If you choose to begin PrEP, you should first be tested for HIV. You should then be tested every 3 months for as long as you are taking PrEP. Pregnancy  If you are premenopausal and you may become pregnant, ask your health care provider about preconception counseling.  If you may become pregnant, take 400 to 800 micrograms (mcg) of folic acid every day.  If you want to prevent pregnancy, talk to your health care provider about birth control (contraception). Osteoporosis and menopause  Osteoporosis is a disease in which the bones lose minerals and strength with aging. This can result in serious bone fractures. Your risk for osteoporosis can be identified using a bone density scan.  If you are 68 years of age or older, or if you are at risk for osteoporosis and fractures, ask your health care provider if you should be screened.  Ask your health care provider whether you should take a calcium or vitamin D supplement to lower your risk for osteoporosis.  Menopause may have certain physical symptoms and risks.  Hormone replacement therapy may reduce some of these symptoms and risks. Talk to your health care provider about whether hormone replacement therapy is right for you. Follow these instructions at home:  Schedule regular health, dental, and eye exams.  Stay current with your immunizations.  Do not use any tobacco products including cigarettes, chewing tobacco, or electronic  cigarettes.  If you are pregnant, do not drink alcohol.  If you are breastfeeding, limit how much and how often you drink alcohol.  Limit alcohol intake to no more than 1 drink per day for nonpregnant women. One drink equals 12 ounces of beer, 5 ounces of wine, or 1 ounces of hard liquor.  Do not use street drugs.  Do not share needles.  Ask your health care provider for help if you need support or information about quitting drugs.  Tell your health care provider if you often feel depressed.  Tell your health care provider if you have ever been abused or do not feel safe at home. This information is not intended to replace advice given to you by your health care provider. Make sure you discuss any questions you have with your health care provider.  Calcium Intake Recommendations You can take Caltrate Plus twice a day or get it through your diet or other OTC supplements (Viactiv, OsCal etc)  Calcium is a mineral that affects many functions in the body, including:  Blood clotting.  Blood vessel function.  Nerve impulse conduction.  Hormone secretion.  Muscle contraction.  Bone and teeth functions.  Most of your body's calcium supply is stored in your bones and teeth. When your calcium stores are low, you may be at risk for low bone mass, bone loss, and bone fractures. Consuming enough calcium helps to grow healthy bones and teeth and to prevent breakdown over time. It is very important that you get enough calcium if you are:  A child undergoing rapid growth.  An adolescent girl.  A pre- or post-menopausal woman.  A woman whose menstrual cycle has stopped due to anorexia nervosa or regular intense exercise.  An individual with lactose intolerance or a milk allergy.  A vegetarian.  What is my plan? Try to consume the recommended amount of calcium daily based on your age. Depending on your overall health, your health care provider may recommend  increased calcium  intake.General daily calcium intake recommendations by age are:  Birth to 6 months: 200 mg.  Infants 7 to 12 months: 260 mg.  Children 1 to 3 years: 700 mg.  Children 4 to 8 years: 1,000 mg.  Children 9 to 13 years: 1,300 mg.  Teens 14 to 18 years: 1,300 mg.  Adults 19 to 50 years: 1,000 mg.  Adult women 51 to 70 years: 1,200 mg.  Adult men 51 to 70 years: 1,000 mg.  Adults 71 years and older: 1,200 mg.  Pregnant and breastfeeding teens: 1,300 mg.  Pregnant and breastfeeding adults: 1,000 mg.  What do I need to know about calcium intake?  In order for the body to absorb calcium, it needs vitamin D. You can get vitamin D through (we recommend getting [phone] units of Vitamin D daily) ? Direct exposure of the skin to sunlight. ? Foods, such as egg yolks, liver, saltwater fish, and fortified milk. ? Supplements.  Consuming too much calcium may cause: ? Constipation. ? Decreased absorption of iron and zinc. ? Kidney stones.  Calcium supplements may interact with certain medicines. Check with your health care provider before starting any calcium supplements.  Try to get most of your calcium from food. What foods can I eat? Grains  Fortified oatmeal. Fortified ready-to-eat cereals. Fortified frozen waffles. Vegetables Turnip greens. Broccoli. Fruits Fortified orange juice. Meats and Other Protein Sources Canned sardines with bones. Canned salmon with bones. Soy beans. Tofu. Baked beans. Almonds. [place] nuts. Sunflower seeds. Dairy Milk. Yogurt. Cheese. Cottage cheese. Beverages Fortified soy milk. Fortified rice milk. Sweets/Desserts Pudding. Ice Cream. Milkshakes. Blackstrap molasses. The items listed above may not be a complete list of recommended foods or beverages. Contact your dietitian for more options. What foods can affect my calcium intake? It may be more difficult for your body to use calcium or calcium may leave your body more quickly if you consume  large amounts of:  Sodium.  Protein.  Caffeine.  Alcohol.  This information is not intended to replace advice given to you by your health care provider. Make sure you discuss any questions you have with your health care provider. Document Released: 05/04/2004 Document Revised: 04/09/2016 Document Reviewed: 02/26/2014 Elsevier Interactive Patient Education  2018 [place] Released: 04/05/2011 Document Revised: 02/26/2016 Document Reviewed: 06/24/2015 Elsevier Interactive Patient Education  2018 [employer].

## 2017-09-16 NOTE — Progress Notes (Signed)
Subjective  Chief Complaint  Patient presents with  . Establish Care  . Annual Exam    HPI: Kathy Shaw is a 61 y.o. female who presents to [place] at [facility] today for a Female Wellness Visit.  She is a new patient to establish care.  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  Past medical history is mostly unremarkable.  She feels well, is active, diet could use some improvement however.  She denies history of hypertension.  She eats a high sodium diet.  She is happily married with multiple children and grandchildren.  She is due Pap smear.  Mammogram was done this week and was normal.  Colorectal cancer screening is up-to-date.  She has a strong family history of multiple cancers including breast cancer, melanoma, colon cancer.  Her mother is also undergoing a workup for new lesions noted on the lung and liver please see her family history documented below.  Nobody in her family has ever had genetic testing. Lifestyle: Body mass index is 29.63 kg/m. Wt Readings from Last 3 Encounters:  09/16/17 171 lb 4 oz (77.7 kg)   Diet: general Exercise: intermittently, walking  Patient Active Problem List   Diagnosis Date Noted  . Family history of colon cancer 09/16/2017  . Family history of malignant melanoma 09/16/2017  . Family history of thyroid cancer 09/16/2017  . Family history of breast cancer in mother    Health Maintenance  Topic Date Due  . Hepatitis C Screening  1955/11/20  . HIV Screening  06/25/1971  . PAP SMEAR  10/26/2015  . MAMMOGRAM  09/14/2018  . COLONOSCOPY  08/14/2019  . TETANUS/TDAP  06/28/2027  . INFLUENZA VACCINE  Completed   Immunization History  Administered Date(s) Administered  . Tdap 06/27/2017   We updated and reviewed the patient's past history in detail and it is documented below. Allergies: Patient  has No Known Allergies. Past Medical History Patient  has a past medical history of Family history of breast cancer in mother, Family history of colon cancer (09/16/2017), History of chicken pox, and History of tear of ACL (anterior cruciate ligament). Past Surgical History Patient  has a past surgical history that includes Knee arthroscopy with anterior cruciate ligament (acl) repair (Left, 2005); Breast surgery (Left); Tonsillectomy (1961); Wisdom tooth extraction (Bilateral); and Breast excisional biopsy (Left, 2008). Family History: Patient family history includes Breast cancer in her maternal grandmother and mother; Cancer in her mother; Colon cancer in her maternal aunt; Diabetes in her father; Healthy in her daughter, daughter, sister, sister, sister, and son; Heart disease in her father; Hypertension in her father; Melanoma in her mother; Myasthenia gravis in her father; Thyroid cancer in her mother. Social History:  Patient  reports that she has quit smoking. Her smoking use included cigarettes. She has a 1.50 pack-year smoking history. she has never used smokeless tobacco. She reports that she drinks about 8.4 oz of alcohol per week. She reports that she does not use drugs.  Review of Systems: Constitutional: negative for fever or malaise Ophthalmic: negative for photophobia, double vision or loss of vision Cardiovascular: negative for chest pain, dyspnea on exertion, or new LE swelling Respiratory: negative for SOB or persistent cough Gastrointestinal: negative for abdominal pain, change in bowel habits or melena Genitourinary: negative for dysuria or gross hematuria, no abnormal uterine bleeding or disharge Musculoskeletal: negative for new  gait disturbance or muscular weakness Integumentary: negative for new or persistent rashes, no breast lumps Neurological: negative for TIA or stroke symptoms Psychiatric: negative for SI or delusions Allergic/Immunologic: negative for  hives  Patient Care Team    Relationship Specialty Notifications Start End  Leamon Arnt, MD PCP - General Family Medicine  09/16/17     Objective  Vitals: BP 136/90 (BP Location: Left Arm, Cuff Size: Large)   Pulse (!) 101   Temp 98.6 F (37 C) (Oral)   Ht 5' 3.75" (1.619 m)   Wt 171 lb 4 oz (77.7 kg)   SpO2 97%   BMI 29.63 kg/m  General:  Well developed, well nourished, no acute distress  Psych:  Alert and orientedx3,normal mood and affect HEENT:  Normocephalic, atraumatic, non-icteric sclera, PERRL, oropharynx is clear without mass or exudate, supple neck without adenopathy, mass or thyromegaly Cardiovascular:  Normal S1, S2, RRR without gallop, rub or murmur, nondisplaced PMI Respiratory:  Good breath sounds bilaterally, CTAB with normal respiratory effort Gastrointestinal: normal bowel sounds, soft, non-tender, no noted masses. No HSM MSK: no deformities, contusions. Joints are without erythema or swelling. Spine and CVA region are nontender Skin:  Warm, no rashes or suspicious lesions noted Neurologic:    Mental status is normal. CN 2-11 are normal. Gross motor and sensory exams are normal. Normal gait. No tremor Breast Exam: No mass, skin retraction or nipple discharge is appreciated in either breast. No axillary adenopathy. Fibrocystic changes are not noted Pelvic Exam: Normal external genitalia, no vulvar or vaginal lesions present. Clear cervix w/o CMT. Bimanual exam reveals a nontender fundus w/o masses, nl size. No adnexal masses present. No inguinal adenopathy. A PAP smear was performed.   Assessment  1. Routine general medical examination at a health care facility   2. Cervical cancer screening   3. Prehypertension   4. Family history of breast cancer in mother   58. Family history of colon cancer   6. Family history of malignant melanoma   7. Need for hepatitis C screening test      Plan  Female Wellness Visit:  Age appropriate Health Maintenance and  Prevention measures were discussed with patient. Included topics are cancer screening recommendations, ways to keep healthy (see AVS) including dietary and exercise recommendations, regular eye and dental care, use of seat belts, and avoidance of moderate alcohol use and tobacco use.  Cancer screenings are up-to-date.  Pap with high-risk HPV screen done today.  We did discuss the likelihood of referring her for genetic counseling.  BMI: discussed patient's BMI and encouraged positive lifestyle modifications to help get to or maintain a target BMI.  Discussed mild weight loss of 2-6 pounds to help improve her blood pressure and decrease her BMI.  HM needs and immunizations were addressed and ordered. See below for orders. See HM and immunization section for updates.  Immunizations are up-to-date  Routine labs and screening tests ordered including cmp, cbc and lipids where appropriate.  Discussed recommendations regarding Vit D and calcium supplementation (see AVS)  Pre-hypertension: Recommend low-sodium diet, mild weight loss and monitor outside of the office.  If readings remain elevated return in 3 months for further discussion.  Follow up: Return in about 12 months (around 09/16/2018) for your complete physical.   Commons side effects, risks, benefits, and alternatives for medications and treatment plan prescribed today were discussed, and the patient expressed understanding of the given instructions. Patient is instructed to call or message via MyChart if he/she has  any questions or concerns regarding our treatment plan. No barriers to understanding were identified. We discussed Red Flag symptoms and signs in detail. Patient expressed understanding regarding what to do in case of urgent or emergency type symptoms.   Medication list was reconciled, printed and provided to the patient in AVS. Patient instructions and summary information was reviewed with the patient as documented in the AVS. This  note was prepared with assistance of Dragon voice recognition software. Occasional wrong-word or sound-a-like substitutions may have occurred due to the inherent limitations of voice recognition software  Orders Placed This Encounter  Procedures  . CBC with Differential  . Comprehensive metabolic panel  . Lipid panel  . HIV antibody  . Hepatitis C antibody   No orders of the defined types were placed in this encounter.

## 2017-09-17 LAB — HEPATITIS C ANTIBODY
HEP C AB: NONREACTIVE
SIGNAL TO CUT-OFF: 0.01 (ref <1.00)

## 2017-09-17 LAB — HIV ANTIBODY (ROUTINE TESTING W REFLEX): HIV: NONREACTIVE

## 2017-09-19 LAB — CYTOLOGY - PAP
DIAGNOSIS: NEGATIVE
HPV: NOT DETECTED

## 2018-09-18 ENCOUNTER — Ambulatory Visit (INDEPENDENT_AMBULATORY_CARE_PROVIDER_SITE_OTHER): Payer: No Typology Code available for payment source | Admitting: Family Medicine

## 2018-09-18 ENCOUNTER — Other Ambulatory Visit: Payer: Self-pay

## 2018-09-18 ENCOUNTER — Encounter: Payer: Self-pay | Admitting: Family Medicine

## 2018-09-18 VITALS — BP 144/90 | HR 71 | Temp 98.1°F | Ht 63.75 in | Wt 171.2 lb

## 2018-09-18 DIAGNOSIS — I10 Essential (primary) hypertension: Secondary | ICD-10-CM

## 2018-09-18 DIAGNOSIS — Z23 Encounter for immunization: Secondary | ICD-10-CM

## 2018-09-18 DIAGNOSIS — K219 Gastro-esophageal reflux disease without esophagitis: Secondary | ICD-10-CM

## 2018-09-18 DIAGNOSIS — Z0000 Encounter for general adult medical examination without abnormal findings: Secondary | ICD-10-CM

## 2018-09-18 LAB — COMPREHENSIVE METABOLIC PANEL
ALK PHOS: 42 U/L (ref 39–117)
ALT: 17 U/L (ref 0–35)
AST: 14 U/L (ref 0–37)
Albumin: 4.4 g/dL (ref 3.5–5.2)
BILIRUBIN TOTAL: 0.6 mg/dL (ref 0.2–1.2)
BUN: 12 mg/dL (ref 6–23)
CO2: 29 meq/L (ref 19–32)
CREATININE: 0.8 mg/dL (ref 0.40–1.20)
Calcium: 9.6 mg/dL (ref 8.4–10.5)
Chloride: 101 mEq/L (ref 96–112)
GFR: 77.19 mL/min (ref >60.00)
GLUCOSE: 81 mg/dL (ref 70–99)
Potassium: 3.6 mEq/L (ref 3.5–5.1)
Sodium: 138 mEq/L (ref 135–145)
TOTAL PROTEIN: 6.7 g/dL (ref 6.0–8.3)

## 2018-09-18 LAB — CBC WITH DIFFERENTIAL/PLATELET
BASOS ABS: 0 10*3/uL (ref 0.0–0.1)
Basophils Relative: 0.7 % (ref 0.0–3.0)
Eosinophils Absolute: 0.1 10*3/uL (ref 0.0–0.7)
Eosinophils Relative: 2 % (ref 0.0–5.0)
HCT: 40.8 % (ref 36.0–46.0)
Hemoglobin: 13.8 g/dL (ref 12.0–15.0)
LYMPHS ABS: 2 10*3/uL (ref 0.7–4.0)
Lymphocytes Relative: 39.5 % (ref 12.0–46.0)
MCHC: 33.8 g/dL (ref 30.0–36.0)
MCV: 95.1 fl (ref 78.0–100.0)
Monocytes Absolute: 0.6 10*3/uL (ref 0.1–1.0)
Monocytes Relative: 11.8 % (ref 3.0–12.0)
NEUTROS ABS: 2.4 10*3/uL (ref 1.4–7.7)
Neutrophils Relative %: 46 % (ref 43.0–77.0)
PLATELETS: 345 10*3/uL (ref 150.0–400.0)
RBC: 4.29 Mil/uL (ref 3.87–5.11)
RDW: 13.4 % (ref 11.5–15.5)
WBC: 5.2 10*3/uL (ref 4.0–10.5)

## 2018-09-18 LAB — LIPID PANEL
Cholesterol: 218 mg/dL — ABNORMAL HIGH (ref 0–200)
HDL: 104.8 mg/dL (ref >39.00)
LDL Cholesterol: 98 mg/dL (ref 0–99)
NONHDL: 112.78
Total CHOL/HDL Ratio: 2
Triglycerides: 73 mg/dL (ref 0.0–149.0)
VLDL: 14.6 mg/dL (ref 0.0–40.0)

## 2018-09-18 LAB — POCT URINALYSIS DIPSTICK
Bilirubin, UA: NEGATIVE
Blood, UA: NEGATIVE
Glucose, UA: NEGATIVE
Ketones, UA: NEGATIVE
LEUKOCYTES UA: NEGATIVE
NITRITE UA: NEGATIVE
PROTEIN UA: NEGATIVE
Urobilinogen, UA: 0.2 E.U./dL
pH, UA: 7 (ref 5.0–8.0)

## 2018-09-18 MED ORDER — OMEPRAZOLE 20 MG PO CPDR: 20.0000 mg | DELAYED_RELEASE_CAPSULE | Freq: Every day | ORAL | 3 refills | Status: DC

## 2018-09-18 MED ORDER — HYDROCHLOROTHIAZIDE 12.5 MG PO CAPS: 12.5000 mg | ORAL_CAPSULE | Freq: Every day | ORAL | 3 refills | Status: DC

## 2018-09-18 MED FILL — OMEPRAZOLE 20 MG CPDR: 20 | 90 days supply | Qty: 90 | Fill #0

## 2018-09-18 MED FILL — HYDROCHLOROTHIAZIDE 12.5 MG: 12.5 | 90 days supply | Qty: 90 | Fill #0

## 2018-09-18 NOTE — Progress Notes (Signed)
Subjective  Chief Complaint  Patient presents with  . Annual Exam    doing well, no complaints. Patient is fasting today, due for mammogram     HPI: Kathy Shaw is a 62 y.o. female who presents to [place] at [facility] today for a Female Wellness Visit.   Wellness Visit: annual visit with health maintenance review and exam without Pap   Doing well. 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 Stressful but coping well.   BP elevated today. Hasn't checked since last year. Feels fine w/o cp or LE edema. Diet is fair. Active lifestyle.   ROS: GERD controlled with intermittent zantac. Ongoing for about 18 months. No melena or weight loss.   Assessment  1. Annual physical exam   2. Essential hypertension   3. GERD without esophagitis      Plan  Female Wellness Visit:  Age appropriate Health Maintenance and Prevention measures were discussed with patient. Included topics are cancer screening recommendations, ways to keep healthy (see AVS) including dietary and exercise recommendations, regular eye and dental care, use of seat belts, and avoidance of moderate alcohol use and tobacco use. Pt to schedule her mammogram and eye exam  BMI: discussed patient's BMI and encouraged positive lifestyle modifications to help get to or maintain a target BMI. rec weight loss for better bp control  HM needs and immunizations were addressed and ordered. See below for orders. See HM and immunization section for updates. shingrix #1 today.  Routine labs and screening tests ordered including cmp, cbc and lipids where appropriate.  Discussed recommendations regarding Vit D and calcium supplementation (see AVS)  GERD: prilosec x 4-6 weeks recommended.  HTN: new dx: start low dose hctz and monitor at work. Recheck 3 months.   Follow up: Return in about 3 months (around 12/18/2018) for follow up Hypertension.   Orders Placed This Encounter  Procedures  .  Varicella-zoster vaccine IM (Shingrix)  . Comprehensive metabolic panel  . CBC with Differential/Platelet  . Lipid panel  . POCT urinalysis dipstick   Meds ordered this encounter  Medications  . hydrochlorothiazide (MICROZIDE) 12.5 MG capsule    Sig: Take 1 capsule (12.5 mg total) by mouth daily.    Dispense:  90 capsule    Refill:  3  . omeprazole (PRILOSEC) 20 MG capsule    Sig: Take 1 capsule (20 mg total) by mouth daily.    Dispense:  30 capsule    Refill:  3     Lifestyle: Body mass index is 29.62 kg/m. Wt Readings from Last 3 Encounters:  09/18/18 171 lb 3.2 oz (77.7 kg)  09/16/17 171 lb 4 oz (77.7 kg)   Diet: general Exercise: rarely,   Patient Active Problem List   Diagnosis Date Noted  . Essential hypertension 09/18/2018  . GERD without esophagitis 09/18/2018  . Family history of colon cancer 09/16/2017    Maternal aunt   . Family history of malignant melanoma 09/16/2017  . Family history of thyroid cancer 09/16/2017    Mother   . Prehypertension 09/16/2017  . Family history of breast cancer in mother    Health Maintenance  Topic Date Due  . MAMMOGRAM  09/14/2018  . COLONOSCOPY  08/14/2019  . PAP SMEAR-Modifier  09/16/2022  . TETANUS/TDAP  06/28/2027  . INFLUENZA VACCINE  Completed  . Hepatitis C Screening  Completed  . HIV Screening  Completed   Immunization History  Administered Date(s) Administered  . Influenza-Unspecified 07/01/2017  .  Tdap 06/27/2017  . Zoster Recombinat (Shingrix) 09/18/2018   We updated and reviewed the patient's past history in detail and it is documented below. Allergies: Patient has No Known Allergies. Past Medical History Patient  has a past medical history of Family history of breast cancer in mother, Family history of colon cancer (09/16/2017), History of chicken pox, and History of tear of ACL (anterior cruciate ligament). Past Surgical History Patient  has a past surgical history that includes Knee arthroscopy  with anterior cruciate ligament (acl) repair (Left, 2005); Breast surgery (Left); Tonsillectomy (1961); Wisdom tooth extraction (Bilateral); and Breast excisional biopsy (Left, 2008). Family History: Patient family history includes Breast cancer in her maternal grandmother and mother; Cancer in her mother; Colon cancer in her maternal aunt; Diabetes in her father; Healthy in her daughter, daughter, sister, sister, sister, and son; Heart disease in her father; Hypertension in her father; Melanoma in her mother; Myasthenia gravis in her father; Thyroid cancer in her mother. Social History:  Patient  reports that she has quit smoking. Her smoking use included cigarettes. She has a 1.50 pack-year smoking history. She has never used smokeless tobacco. She reports current alcohol use of about 14.0 standard drinks of alcohol per week. She reports that she does not use drugs.  Review of Systems: Constitutional: negative for fever or malaise Ophthalmic: negative for photophobia, double vision or loss of vision Cardiovascular: negative for chest pain, dyspnea on exertion, or new LE swelling Respiratory: negative for SOB or persistent cough Gastrointestinal: negative for abdominal pain, change in bowel habits or melena Genitourinary: negative for dysuria or gross hematuria, no abnormal uterine bleeding or disharge Musculoskeletal: negative for new gait disturbance or muscular weakness Integumentary: negative for new or persistent rashes, no breast lumps Neurological: negative for TIA or stroke symptoms Psychiatric: negative for SI or delusions Allergic/Immunologic: negative for hives  Patient Care Team    Relationship Specialty Notifications Start End  Leamon Arnt, MD PCP - General Family Medicine  09/16/17     BP Readings from Last 3 Encounters:  09/18/18 (!) 144/90  09/16/17 136/90    Objective  Vitals: BP (!) 144/90   Pulse 71   Temp 98.1 F (36.7 C)   Ht 5' 3.75" (1.619 m)   Wt 171 lb  3.2 oz (77.7 kg)   SpO2 98%   BMI 29.62 kg/m  General:  Well developed, well nourished, no acute distress  Psych:  Alert and orientedx3,normal mood and affect HEENT:  Normocephalic, atraumatic, non-icteric sclera, PERRL, oropharynx is clear without mass or exudate, supple neck without adenopathy, mass or thyromegaly Cardiovascular:  Normal S1, S2, RRR without gallop, rub or murmur, nondisplaced PMI Respiratory:  Good breath sounds bilaterally, CTAB with normal respiratory effort Gastrointestinal: normal bowel sounds, soft, non-tender, no noted masses. No HSM MSK: no deformities, contusions. Joints are without erythema or swelling. Spine and CVA region are nontender Skin:  Warm, no rashes or suspicious lesions noted Neurologic:    Mental status is normal. CN 2-11 are normal. Gross motor and sensory exams are normal. Normal gait. No tremor Breast Exam: No mass, skin retraction or nipple discharge is appreciated in either breast. No axillary adenopathy. Fibrocystic changes are noted bilaterally   Commons side effects, risks, benefits, and alternatives for medications and treatment plan prescribed today were discussed, and the patient expressed understanding of the given instructions. Patient is instructed to call or message via MyChart if he/she has any questions or concerns regarding our treatment plan. No barriers to understanding  were identified. We discussed Red Flag symptoms and signs in detail. Patient expressed understanding regarding what to do in case of urgent or emergency type symptoms.   Medication list was reconciled, printed and provided to the patient in AVS. Patient instructions and summary information was reviewed with the patient as documented in the AVS. This note was prepared with assistance of Dragon voice recognition software. Occasional wrong-word or sound-a-like substitutions may have occurred due to the inherent limitations of voice recognition software

## 2018-09-18 NOTE — Patient Instructions (Addendum)
Please return in 3 months to recheck your blood pressure on the medication. Bring in your home readings at that time. As well, please schedule a nurse visit for your 2nd shingrix vaccine in 2-6 months.   Please schedule your mammogram and eye exam. Start the blood pressure medication daily and check your blood pressures: goal BP is < 130/80; I will increase your medication dose if it remains > 135/85.  Happy Holidays!   If you have any questions or concerns, please don't hesitate to send me a message via MyChart or call the office at [phone]. Thank you for visiting with [place] today! It's our pleasure caring for you.  Hypertension Hypertension, commonly called high blood pressure, is when the force of blood pumping through the arteries is too strong. The arteries are the blood vessels that carry blood from the heart throughout the body. Hypertension forces the heart to work harder to pump blood and may cause arteries to become narrow or stiff. Having untreated or uncontrolled hypertension can cause heart attacks, strokes, kidney disease, and other problems. A blood pressure reading consists of a higher number over a lower number. Ideally, your blood pressure should be below 120/80. The first ("top") number is called the systolic pressure. It is a measure of the pressure in your arteries as your heart beats. The second ("bottom") number is called the diastolic pressure. It is a measure of the pressure in your arteries as the heart relaxes. What are the causes? The cause of this condition is not known. What increases the risk? Some risk factors for high blood pressure are under your control. Others are not. Factors you can change  Smoking.  Having type 2 diabetes mellitus, high cholesterol, or both.  Not getting enough exercise or physical activity.  Being overweight.  Having too much fat, sugar, calories, or salt (sodium) in your diet.  Drinking too much alcohol. Factors that are  difficult or impossible to change  Having chronic kidney disease.  Having a family history of high blood pressure.  Age. Risk increases with age.  Race. You may be at higher risk if you are African-American.  Gender. Men are at higher risk than women before age 27. After age 80, women are at higher risk than men.  Having obstructive sleep apnea.  Stress. What are the signs or symptoms? Extremely high blood pressure (hypertensive crisis) may cause:  Headache.  Anxiety.  Shortness of breath.  Nosebleed.  Nausea and vomiting.  Severe chest pain.  Jerky movements you cannot control (seizures).  How is this diagnosed? This condition is diagnosed by measuring your blood pressure while you are seated, with your arm resting on a surface. The cuff of the blood pressure monitor will be placed directly against the skin of your upper arm at the level of your heart. It should be measured at least twice using the same arm. Certain conditions can cause a difference in blood pressure between your right and left arms. Certain factors can cause blood pressure readings to be lower or higher than normal (elevated) for a short period of time:  When your blood pressure is higher when you are in a health care provider's office than when you are at home, this is called white coat hypertension. Most people with this condition do not need medicines.  When your blood pressure is higher at home than when you are in a health care provider's office, this is called masked hypertension. Most people with this condition may need medicines to  control blood pressure.  If you have a high blood pressure reading during one visit or you have normal blood pressure with other risk factors:  You may be asked to return on a different day to have your blood pressure checked again.  You may be asked to monitor your blood pressure at home for 1 week or longer.  If you are diagnosed with hypertension, you may have  other blood or imaging tests to help your health care provider understand your overall risk for other conditions. How is this treated? This condition is treated by making healthy lifestyle changes, such as eating healthy foods, exercising more, and reducing your alcohol intake. Your health care provider may prescribe medicine if lifestyle changes are not enough to get your blood pressure under control, and if:  Your systolic blood pressure is above 130.  Your diastolic blood pressure is above 80.  Your personal target blood pressure may vary depending on your medical conditions, your age, and other factors. Follow these instructions at home: Eating and drinking  Eat a diet that is high in fiber and potassium, and low in sodium, added sugar, and fat. An example eating plan is called the DASH (Dietary Approaches to Stop Hypertension) diet. To eat this way: ? Eat plenty of fresh fruits and vegetables. Try to fill half of your plate at each meal with fruits and vegetables. ? Eat whole grains, such as whole wheat pasta, brown rice, or whole grain bread. Fill about one quarter of your plate with whole grains. ? Eat or drink low-fat dairy products, such as skim milk or low-fat yogurt. ? Avoid fatty cuts of meat, processed or cured meats, and poultry with skin. Fill about one quarter of your plate with lean proteins, such as fish, chicken without skin, beans, eggs, and tofu. ? Avoid premade and processed foods. These tend to be higher in sodium, added sugar, and fat.  Reduce your daily sodium intake. Most people with hypertension should eat less than 1,500 mg of sodium a day.  Limit alcohol intake to no more than 1 drink a day for nonpregnant women and 2 drinks a day for men. One drink equals 12 oz of beer, 5 oz of wine, or 1 oz of hard liquor. Lifestyle  Work with your health care provider to maintain a healthy body weight or to lose weight. Ask what an ideal weight is for you.  Get at least 30  minutes of exercise that causes your heart to beat faster (aerobic exercise) most days of the week. Activities may include walking, swimming, or biking.  Include exercise to strengthen your muscles (resistance exercise), such as pilates or lifting weights, as part of your weekly exercise routine. Try to do these types of exercises for 30 minutes at least 3 days a week.  Do not use any products that contain nicotine or tobacco, such as cigarettes and e-cigarettes. If you need help quitting, ask your health care provider.  Monitor your blood pressure at home as told by your health care provider.  Keep all follow-up visits as told by your health care provider. This is important. Medicines  Take over-the-counter and prescription medicines only as told by your health care provider. Follow directions carefully. Blood pressure medicines must be taken as prescribed.  Do not skip doses of blood pressure medicine. Doing this puts you at risk for problems and can make the medicine less effective.  Ask your health care provider about side effects or reactions to medicines that you  should watch for. Contact a health care provider if:  You think you are having a reaction to a medicine you are taking.  You have headaches that keep coming back (recurring).  You feel dizzy.  You have swelling in your ankles.  You have trouble with your vision. Get help right away if:  You develop a severe headache or confusion.  You have unusual weakness or numbness.  You feel faint.  You have severe pain in your chest or abdomen.  You vomit repeatedly.  You have trouble breathing. Summary  Hypertension is when the force of blood pumping through your arteries is too strong. If this condition is not controlled, it may put you at risk for serious complications.  Your personal target blood pressure may vary depending on your medical conditions, your age, and other factors. For most people, a normal blood  pressure is less than 120/80.  Hypertension is treated with lifestyle changes, medicines, or a combination of both. Lifestyle changes include weight loss, eating a healthy, low-sodium diet, exercising more, and limiting alcohol. This information is not intended to replace advice given to you by your health care provider. Make sure you discuss any questions you have with your health care provider. Document Released: 09/20/2005 Document Revised: 08/18/2016 Document Reviewed: 08/18/2016 Elsevier Interactive Patient Education  [employer].   Recommendations for women to keep healthy:   EXERCISE AND DIET: We recommended that you start or continue a regular exercise program for good health. Regular exercise means any activity that makes your heart beat faster and makes you sweat. We recommend exercising at least 30 minutes per day at least 3 days a week, preferably 4 or 5. We also recommend a diet low in fat and sugar. Inactivity, poor dietary choices and obesity can cause diabetes, heart attack, stroke, and kidney damage, among others.   ALCOHOL AND SMOKING: Women should limit their alcohol intake to no more than 7 drinks/beers/glasses of wine (combined, not each!) per week. Moderation of alcohol intake to this level decreases your risk of breast cancer and liver damage. And of course, no recreational drugs are part of a healthy lifestyle. And absolutely no smoking or even second hand smoke. Most people know smoking can cause heart and lung diseases, but did you know it also contributes to weakening of your bones? Aging of your skin? Yellowing of your teeth and nails?  CALCIUM AND VITAMIN D: Adequate intake of calcium and Vitamin D are recommended. The recommendations for exact amounts of these supplements seem to change often, but generally speaking 600 mg of calcium (either carbonate or citrate) and 800 units of Vitamin D per day seems prudent. Certain women may benefit from higher intake of  Vitamin D. If you are among these women, your doctor will have told you during your visit.   PAP SMEARS: Pap smears, to check for cervical cancer or precancers, have traditionally been done yearly, although recent scientific advances have shown that most women can have pap smears less often. However, every woman still should have a physical exam from her gynecologist every year. It will include a breast check, inspection of the vulva and vagina to check for abnormal growths or skin changes, a visual exam of the cervix, and then an exam to evaluate the size and shape of the uterus and ovaries. And after 62 years of age, a rectal exam is indicated to check for rectal cancers. We will also provide age appropriate advice regarding health maintenance, like when you should  have certain vaccines, screening for sexually transmitted diseases, bone density testing, colonoscopy, mammograms, etc.   MAMMOGRAMS: All women over 41 years old should have a yearly mammogram. Many facilities now offer a "3D" mammogram, which may cost around $50 extra out of pocket. If possible, we recommend you accept the option to have the 3D mammogram performed. It both reduces the number of women who will be called back for extra views which then turn out to be normal, and it is better than the routine mammogram at detecting truly abnormal areas.   COLONOSCOPY: Colonoscopy to screen for colon cancer is recommended for all women at age 46. We know, you hate the idea of the prep. We agree, BUT, having colon cancer and not knowing it is worse!! Colon cancer so often starts as a polyp that can be seen and removed at colonscopy, which can quite literally save your life! And if your first colonoscopy is normal and you have no family history of colon cancer, most women don't have to have it again for 10 years. Once every ten years, you can do something that may end up saving your life, right? We will be happy to help you get it scheduled when you  are ready. Be sure to check your insurance coverage so you understand how much it will cost. It may be covered as a preventative service at no cost, but you should check your particular policy.

## 2018-09-22 ENCOUNTER — Other Ambulatory Visit: Payer: Self-pay | Admitting: Family Medicine

## 2018-09-22 DIAGNOSIS — Z1231 Encounter for screening mammogram for malignant neoplasm of breast: Secondary | ICD-10-CM

## 2018-11-09 ENCOUNTER — Ambulatory Visit
Admission: RE | Admit: 2018-11-09 | Discharge: 2018-11-09 | Disposition: A | Discharge status: Home / Self Care | Payer: No Typology Code available for payment source | Source: Ambulatory Visit | Attending: Family Medicine | Admitting: Family Medicine

## 2018-11-09 DIAGNOSIS — Z1231 Encounter for screening mammogram for malignant neoplasm of breast: Secondary | ICD-10-CM

## 2018-11-10 ENCOUNTER — Other Ambulatory Visit: Payer: Self-pay | Admitting: Family Medicine

## 2018-11-10 DIAGNOSIS — R928 Other abnormal and inconclusive findings on diagnostic imaging of breast: Secondary | ICD-10-CM

## 2018-11-20 ENCOUNTER — Other Ambulatory Visit: Payer: Self-pay | Admitting: Family Medicine

## 2018-11-20 ENCOUNTER — Other Ambulatory Visit: Payer: Self-pay

## 2018-11-20 DIAGNOSIS — R928 Other abnormal and inconclusive findings on diagnostic imaging of breast: Secondary | ICD-10-CM

## 2018-11-22 ENCOUNTER — Ambulatory Visit
Admission: RE | Admit: 2018-11-22 | Discharge: 2018-11-22 | Disposition: A | Discharge status: Home / Self Care | Payer: No Typology Code available for payment source | Source: Ambulatory Visit | Attending: Family Medicine | Admitting: Family Medicine

## 2018-11-22 DIAGNOSIS — R928 Other abnormal and inconclusive findings on diagnostic imaging of breast: Secondary | ICD-10-CM

## 2018-12-14 MED FILL — OMEPRAZOLE 20 MG CPDR: 20 | 30 days supply | Qty: 30 | Fill #1

## 2018-12-14 MED FILL — HYDROCHLOROTHIAZIDE 12.5 MG: 12.5 | 90 days supply | Qty: 90 | Fill #1

## 2018-12-20 ENCOUNTER — Ambulatory Visit: Payer: No Typology Code available for payment source | Admitting: Family Medicine

## 2019-01-05 ENCOUNTER — Ambulatory Visit: Payer: No Typology Code available for payment source | Admitting: Family Medicine

## 2019-04-23 MED FILL — HYDROCHLOROTHIAZIDE 12.5 MG: 12.5 | 90 days supply | Qty: 90 | Fill #2

## 2019-10-08 ENCOUNTER — Other Ambulatory Visit: Payer: Self-pay | Admitting: Family Medicine

## 2019-10-08 MED FILL — HYDROCHLOROTHIAZIDE 12.5 MG: 12.5 | 90 days supply | Qty: 90 | Fill #0

## 2019-10-08 NOTE — Telephone Encounter (Signed)
  LAST APPOINTMENT DATE: 09/16/2017  NEXT APPOINTMENT DATE: 01/14/2020  MEDICATION: Hydrochlorothiazide 12.5 mg cap  PHARMACY: [facility]   LAST REFILL: 04/22/2019  QTY: 90  REFILLS: 3    OTHER COMMENTS:    Okay for refill?  Please advise

## 2019-10-08 NOTE — Telephone Encounter (Signed)
Hey jasmine, Careful to double check your dot phrase information.  Sometimes it isn't pulling in any information; other times it is incorrect. For ex, this patient was here 09/21/18.  Thanks! Please refill this for 90 days but pt needs to set up OV; no further refills w/o OV.  Thanks.

## 2019-10-08 NOTE — Telephone Encounter (Signed)
Jasmine, You refilled for a year.  Pt is overdue for labs and cpe.  Please call her to schedule. Thanks.

## 2019-12-10 ENCOUNTER — Other Ambulatory Visit: Payer: Self-pay | Admitting: Family Medicine

## 2019-12-10 DIAGNOSIS — Z1231 Encounter for screening mammogram for malignant neoplasm of breast: Secondary | ICD-10-CM

## 2019-12-18 ENCOUNTER — Other Ambulatory Visit: Payer: Self-pay

## 2019-12-18 ENCOUNTER — Ambulatory Visit
Admission: RE | Admit: 2019-12-18 | Discharge: 2019-12-18 | Disposition: A | Discharge status: Home / Self Care | Payer: No Typology Code available for payment source | Source: Ambulatory Visit

## 2019-12-18 DIAGNOSIS — Z1231 Encounter for screening mammogram for malignant neoplasm of breast: Secondary | ICD-10-CM

## 2019-12-19 ENCOUNTER — Other Ambulatory Visit: Payer: Self-pay | Admitting: Family Medicine

## 2019-12-19 DIAGNOSIS — R928 Other abnormal and inconclusive findings on diagnostic imaging of breast: Secondary | ICD-10-CM

## 2020-01-14 ENCOUNTER — Other Ambulatory Visit: Payer: Self-pay

## 2020-01-14 ENCOUNTER — Encounter: Payer: Self-pay | Admitting: Family Medicine

## 2020-01-14 ENCOUNTER — Ambulatory Visit (INDEPENDENT_AMBULATORY_CARE_PROVIDER_SITE_OTHER): Payer: No Typology Code available for payment source | Admitting: Family Medicine

## 2020-01-14 ENCOUNTER — Ambulatory Visit: Payer: No Typology Code available for payment source

## 2020-01-14 ENCOUNTER — Ambulatory Visit
Admission: RE | Admit: 2020-01-14 | Discharge: 2020-01-14 | Disposition: A | Discharge status: Home / Self Care | Payer: No Typology Code available for payment source | Source: Ambulatory Visit | Attending: Family Medicine | Admitting: Family Medicine

## 2020-01-14 VITALS — BP 122/76 | HR 75 | Temp 97.8°F | Resp 18 | Ht 64.0 in | Wt 168.6 lb

## 2020-01-14 DIAGNOSIS — Z0000 Encounter for general adult medical examination without abnormal findings: Secondary | ICD-10-CM

## 2020-01-14 DIAGNOSIS — K635 Polyp of colon: Secondary | ICD-10-CM | POA: Diagnosis not present

## 2020-01-14 DIAGNOSIS — M25512 Pain in left shoulder: Secondary | ICD-10-CM

## 2020-01-14 DIAGNOSIS — Z23 Encounter for immunization: Secondary | ICD-10-CM

## 2020-01-14 DIAGNOSIS — R928 Other abnormal and inconclusive findings on diagnostic imaging of breast: Secondary | ICD-10-CM

## 2020-01-14 DIAGNOSIS — I10 Essential (primary) hypertension: Secondary | ICD-10-CM | POA: Diagnosis not present

## 2020-01-14 DIAGNOSIS — Z800 Family history of malignant neoplasm of digestive organs: Secondary | ICD-10-CM | POA: Diagnosis not present

## 2020-01-14 DIAGNOSIS — D369 Benign neoplasm, unspecified site: Secondary | ICD-10-CM | POA: Insufficient documentation

## 2020-01-14 LAB — POCT URINALYSIS DIPSTICK
Bilirubin, UA: NEGATIVE
Blood, UA: POSITIVE
Glucose, UA: NEGATIVE
Ketones, UA: NEGATIVE
Nitrite, UA: NEGATIVE
Protein, UA: NEGATIVE
Spec Grav, UA: 1.025 (ref 1.010–1.025)
Urobilinogen, UA: 0.2 E.U./dL
pH, UA: 6 (ref 5.0–8.0)

## 2020-01-14 LAB — LIPID PANEL
Cholesterol: 228 mg/dL — ABNORMAL HIGH (ref 0–200)
HDL: 102 mg/dL (ref >39.00)
LDL Cholesterol: 97 mg/dL (ref 0–99)
NonHDL: 126.4
Total CHOL/HDL Ratio: 2
Triglycerides: 146 mg/dL (ref 0.0–149.0)
VLDL: 29.2 mg/dL (ref 0.0–40.0)

## 2020-01-14 LAB — CBC WITH DIFFERENTIAL/PLATELET
Basophils Absolute: 0 10*3/uL (ref 0.0–0.1)
Basophils Relative: 0.6 % (ref 0.0–3.0)
Eosinophils Absolute: 0.1 10*3/uL (ref 0.0–0.7)
Eosinophils Relative: 1.6 % (ref 0.0–5.0)
HCT: 38.5 % (ref 36.0–46.0)
Hemoglobin: 13.2 g/dL (ref 12.0–15.0)
Lymphocytes Relative: 39.2 % (ref 12.0–46.0)
Lymphs Abs: 2.1 10*3/uL (ref 0.7–4.0)
MCHC: 34.3 g/dL (ref 30.0–36.0)
MCV: 96.7 fl (ref 78.0–100.0)
Monocytes Absolute: 0.5 10*3/uL (ref 0.1–1.0)
Monocytes Relative: 9.3 % (ref 3.0–12.0)
Neutro Abs: 2.6 10*3/uL (ref 1.4–7.7)
Neutrophils Relative %: 49.3 % (ref 43.0–77.0)
Platelets: 307 10*3/uL (ref 150.0–400.0)
RBC: 3.98 Mil/uL (ref 3.87–5.11)
RDW: 13.4 % (ref 11.5–15.5)
WBC: 5.3 10*3/uL (ref 4.0–10.5)

## 2020-01-14 LAB — COMPREHENSIVE METABOLIC PANEL
ALT: 16 U/L (ref 0–35)
AST: 13 U/L (ref 0–37)
Albumin: 4.4 g/dL (ref 3.5–5.2)
Alkaline Phosphatase: 38 U/L — ABNORMAL LOW (ref 39–117)
BUN: 12 mg/dL (ref 6–23)
CO2: 27 mEq/L (ref 19–32)
Calcium: 9.8 mg/dL (ref 8.4–10.5)
Chloride: 101 mEq/L (ref 96–112)
Creatinine, Ser: 0.82 mg/dL (ref 0.40–1.20)
GFR: 70.28 mL/min (ref >60.00)
Glucose, Bld: 95 mg/dL (ref 70–99)
Potassium: 3.7 mEq/L (ref 3.5–5.1)
Sodium: 140 mEq/L (ref 135–145)
Total Bilirubin: 0.4 mg/dL (ref 0.2–1.2)
Total Protein: 6.4 g/dL (ref 6.0–8.3)

## 2020-01-14 LAB — TSH: TSH: 2.73 u[IU]/mL (ref 0.35–4.50)

## 2020-01-14 MED ORDER — TRIAMCINOLONE ACETONIDE 40 MG/ML IJ SUSP: 40.0000 mg | Freq: Once | INTRAMUSCULAR | Status: DC

## 2020-01-14 NOTE — Patient Instructions (Signed)
Please return in 12 months for your annual complete physical and blood pressure follow up; please come fasting.  I will release your lab results to you on your MyChart account with further instructions. Please reply with any questions.  Today you were given your 2nd of two Shingrix vaccination.   If you have any questions or concerns, please don't hesitate to send me a message via MyChart or call the office at [phone]. Thank you for visiting with [place] today! It's our pleasure caring for you.   Preventive Care 41-51 Years Old, Female Preventive care refers to visits with your health care provider and lifestyle choices that can promote health and wellness. This includes:  A yearly physical exam. This may also be called an annual well check.  Regular dental visits and eye exams.  Immunizations.  Screening for certain conditions.  Healthy lifestyle choices, such as eating a healthy diet, getting regular exercise, not using drugs or products that contain nicotine and tobacco, and limiting alcohol use. What can I expect for my preventive care visit? Physical exam Your health care provider will check your:  Height and weight. This may be used to calculate body mass index (BMI), which tells if you are at a healthy weight.  Heart rate and blood pressure.  Skin for abnormal spots. Counseling Your health care provider may ask you questions about your:  Alcohol, tobacco, and drug use.  Emotional well-being.  Home and relationship well-being.  Sexual activity.  Eating habits.  Work and work Statistician.  Method of birth control.  Menstrual cycle.  Pregnancy history. What immunizations do I need?  Influenza (flu) vaccine  This is recommended every year. Tetanus, diphtheria, and pertussis (Tdap) vaccine  You may need a Td booster every 10 years. Varicella (chickenpox) vaccine  You may need this if you have not been vaccinated. Zoster (shingles) vaccine  You may need  this after age 16. Measles, mumps, and rubella (MMR) vaccine  You may need at least one dose of MMR if you were born in 1957 or later. You may also need a second dose. Pneumococcal conjugate (PCV13) vaccine  You may need this if you have certain conditions and were not previously vaccinated. Pneumococcal polysaccharide (PPSV23) vaccine  You may need one or two doses if you smoke cigarettes or if you have certain conditions. Meningococcal conjugate (MenACWY) vaccine  You may need this if you have certain conditions. Hepatitis A vaccine  You may need this if you have certain conditions or if you travel or work in places where you may be exposed to hepatitis A. Hepatitis B vaccine  You may need this if you have certain conditions or if you travel or work in places where you may be exposed to hepatitis B. Haemophilus influenzae type b (Hib) vaccine  You may need this if you have certain conditions. Human papillomavirus (HPV) vaccine  If recommended by your health care provider, you may need three doses over 6 months. You may receive vaccines as individual doses or as more than one vaccine together in one shot (combination vaccines). Talk with your health care provider about the risks and benefits of combination vaccines. What tests do I need? Blood tests  Lipid and cholesterol levels. These may be checked every 5 years, or more frequently if you are over 23 years old.  Hepatitis C test.  Hepatitis B test. Screening  Lung cancer screening. You may have this screening every year starting at age 66 if you have a 30-pack-year history  of smoking and currently smoke or have quit within the past 15 years.  Colorectal cancer screening. All adults should have this screening starting at age 28 and continuing until age 42. Your health care provider may recommend screening at age 54 if you are at increased risk. You will have tests every 1-10 years, depending on your results and the type of  screening test.  Diabetes screening. This is done by checking your blood sugar (glucose) after you have not eaten for a while (fasting). You may have this done every 1-3 years.  Mammogram. This may be done every 1-2 years. Talk with your health care provider about when you should start having regular mammograms. This may depend on whether you have a family history of breast cancer.  BRCA-related cancer screening. This may be done if you have a family history of breast, ovarian, tubal, or peritoneal cancers.  Pelvic exam and Pap test. This may be done every 3 years starting at age 57. Starting at age 56, this may be done every 5 years if you have a Pap test in combination with an HPV test. Other tests  Sexually transmitted disease (STD) testing.  Bone density scan. This is done to screen for osteoporosis. You may have this scan if you are at high risk for osteoporosis. Follow these instructions at home: Eating and drinking  Eat a diet that includes fresh fruits and vegetables, whole grains, lean protein, and low-fat dairy.  Take vitamin and mineral supplements as recommended by your health care provider.  Do not drink alcohol if: ? Your health care provider tells you not to drink. ? You are pregnant, may be pregnant, or are planning to become pregnant.  If you drink alcohol: ? Limit how much you have to 0-1 drink a day. ? Be aware of how much alcohol is in your drink. In the [place] one drink equals one 12 oz bottle of beer (355 mL), one 5 oz glass of wine (148 mL), or one 1 oz glass of hard liquor (44 mL). Lifestyle  Take daily care of your teeth and gums.  Stay active. Exercise for at least 30 minutes on 5 or more days each week.  Do not use any products that contain nicotine or tobacco, such as cigarettes, e-cigarettes, and chewing tobacco. If you need help quitting, ask your health care provider.  If you are sexually active, practice safe sex. Use a condom or other form of birth  control (contraception) in order to prevent pregnancy and STIs (sexually transmitted infections).  If told by your health care provider, take low-dose aspirin daily starting at age 73. What's next?  Visit your health care provider once a year for a well check visit.  Ask your health care provider how often you should have your eyes and teeth checked.  Stay up to date on all vaccines. This information is not intended to replace advice given to you by your health care provider. Make sure you discuss any questions you have with your health care provider. Document Revised: 06/01/2018 Document Reviewed: 06/01/2018 Elsevier Patient Education  2020 [employer].

## 2020-01-14 NOTE — Progress Notes (Signed)
Subjective  Chief Complaint  Patient presents with  . Annual Exam    Patient would like to know if she needs to get a 2nd shingles shot. She stated that she got the first one right before covid happened.     HPI: Kathy Shaw is a 64 y.o. female who presents to [facility] today for a Female Wellness Visit. She also has the concerns and/or needs as listed above in the chief complaint. These will be addressed in addition to the Health Maintenance Visit.   Wellness Visit: annual visit with health maintenance review and exam without Pap   HM: had f/u mammogram today and it was normal. Pap is up to date. Overdue for colonoscopy: pt is deferring until no longer needs neg Covid test prior to test. No sxs. Healthy lifestyle Chronic disease f/u and/or acute problem visit: (deemed necessary to be done in addition to the wellness visit):  HTN: well controlled on low dose diuretic. Feeling well. Taking medications w/o adverse effects. No symptoms of CHF, angina; no palpitations, sob, cp or lower extremity edema. Compliant with meds.    Assessment  1. Annual physical exam   2. Essential hypertension   3. Family history of colon cancer   4. Polyp of colon, unspecified part of colon, unspecified type         Plan  Female Wellness Visit:  Age appropriate Health Maintenance and Prevention measures were discussed with patient. Included topics are cancer screening recommendations, ways to keep healthy (see AVS) including dietary and exercise recommendations, regular eye and dental care, use of seat belts, and avoidance of moderate alcohol use and tobacco use. rec scheduling colonoscopy.  BMI: discussed patient's BMI and encouraged positive lifestyle modifications to help get to or maintain a target BMI.  HM needs and immunizations were addressed and ordered. See below for orders. See HM and immunization section for updates. 2nd of 2 shingrix given today  Routine labs  and screening tests ordered including cmp, cbc and lipids where appropriate.  Discussed recommendations regarding Vit D and calcium supplementation (see AVS)  Chronic disease management visit and/or acute problem visit:  HTN: This medical condition is well controlled. There are no signs of complications, medication side effects, or red flags. Patient is instructed to continue the current treatment plan without change in therapies or medications.   Colon polyp and distant FH of colon cancer. Pt to schedule when ready.  Follow up: Return in about 1 year (around 01/13/2021) for complete physical, follow up Hypertension.  Orders Placed This Encounter  Procedures  . CBC with Differential/Platelet  . Comprehensive metabolic panel  . Lipid panel  . TSH  . POCT urinalysis dipstick   Meds ordered this encounter  Medications  . triamcinolone acetonide (KENALOG-40) injection 40 mg      Lifestyle: Body mass index is 28.94 kg/m. Wt Readings from Last 3 Encounters:  01/14/20 168 lb 9.6 oz (76.5 kg)  09/18/18 171 lb 3.2 oz (77.7 kg)  09/16/17 171 lb 4 oz (77.7 kg)    Patient Active Problem List   Diagnosis Date Noted  . Colon polyp 01/14/2020    Dr. Ardis Hughs, colonoscopy 2010; 10 year recall   . Essential hypertension 09/18/2018  . GERD without esophagitis 09/18/2018  . Family history of colon cancer 09/16/2017    Maternal aunt   . Family history of malignant melanoma 09/16/2017  . Family history of thyroid cancer 09/16/2017    Mother   .  Family history of breast cancer in mother    Health Maintenance  Topic Date Due  . COLONOSCOPY  01/13/2021 (Originally 08/14/2019)  . INFLUENZA VACCINE  05/04/2020  . MAMMOGRAM  12/17/2020  . PAP SMEAR-Modifier  09/16/2022  . TETANUS/TDAP  06/28/2027  . Hepatitis C Screening  Completed  . HIV Screening  Completed   Immunization History  Administered Date(s) Administered  . Influenza-Unspecified 07/01/2017, 06/27/2019  . PFIZER SARS-COV-2  Vaccination 09/30/2019, 10/22/2019  . Tdap 06/27/2017  . Zoster Recombinat (Shingrix) 09/18/2018   We updated and reviewed the patient's past history in detail and it is documented below. Allergies: Patient has No Known Allergies. Past Medical History Patient  has a past medical history of Family history of breast cancer in mother, Family history of colon cancer (09/16/2017), History of chicken pox, and History of tear of ACL (anterior cruciate ligament). Past Surgical History Patient  has a past surgical history that includes Knee arthroscopy with anterior cruciate ligament (acl) repair (Left, 2005); Breast surgery (Left); Tonsillectomy (1961); Wisdom tooth extraction (Bilateral); and Breast excisional biopsy (Left, 2008). Family History: Patient family history includes Breast cancer in her maternal grandmother and mother; Cancer in her mother; Colon cancer in her maternal aunt; Diabetes in her father; Healthy in her daughter, daughter, sister, sister, sister, and son; Heart disease in her father; Hypertension in her father; Melanoma in her mother; Myasthenia gravis in her father; Thyroid cancer in her mother. Social History:  Patient  reports that she has quit smoking. Her smoking use included cigarettes. She has a 1.50 pack-year smoking history. She has never used smokeless tobacco. She reports current alcohol use of about 14.0 standard drinks of alcohol per week. She reports that she does not use drugs.  Review of Systems: Constitutional: negative for fever or malaise Ophthalmic: negative for photophobia, double vision or loss of vision Cardiovascular: negative for chest pain, dyspnea on exertion, or new LE swelling Respiratory: negative for SOB or persistent cough Gastrointestinal: negative for abdominal pain, change in bowel habits or melena Genitourinary: negative for dysuria or gross hematuria, no abnormal uterine bleeding or disharge Musculoskeletal: negative for new gait disturbance  or muscular weakness Integumentary: negative for new or persistent rashes, no breast lumps Neurological: negative for TIA or stroke symptoms Psychiatric: negative for SI or delusions Allergic/Immunologic: negative for hives  Patient Care Team    Relationship Specialty Notifications Start End  Leamon Arnt, MD PCP - General Family Medicine  09/16/17   Milus Banister, MD Consulting Physician Gastroenterology  01/14/20     Objective  Vitals: BP 122/76   Pulse 75   Temp 97.8 F (36.6 C) (Temporal)   Resp 18   Ht 5\' 4"  (1.626 m)   Wt 168 lb 9.6 oz (76.5 kg)   SpO2 95%   BMI 28.94 kg/m  General:  Well developed, well nourished, no acute distress  Psych:  Alert and orientedx3,normal mood and affect HEENT:  Normocephalic, atraumatic, non-icteric sclera,  supple neck without adenopathy, mass or thyromegaly Cardiovascular:  Normal S1, S2, RRR without gallop, rub or murmur Respiratory:  Good breath sounds bilaterally, CTAB with normal respiratory effort Gastrointestinal: normal bowel sounds, soft, non-tender, no noted masses. No HSM MSK: no deformities, contusions. Joints are without erythema or swelling.  Skin:  Warm, no rashes or suspicious lesions noted Neurologic:    Mental status is normal. Gross motor and sensory exams are normal. Normal gait. No tremor Breast Exam: pt declined    Commons side effects, risks, benefits,  and alternatives for medications and treatment plan prescribed today were discussed, and the patient expressed understanding of the given instructions. Patient is instructed to call or message via MyChart if he/she has any questions or concerns regarding our treatment plan. No barriers to understanding were identified. We discussed Red Flag symptoms and signs in detail. Patient expressed understanding regarding what to do in case of urgent or emergency type symptoms.   Medication list was reconciled, printed and provided to the patient in AVS. Patient instructions  and summary information was reviewed with the patient as documented in the AVS. This note was prepared with assistance of Dragon voice recognition software. Occasional wrong-word or sound-a-like substitutions may have occurred due to the inherent limitations of voice recognition software  This visit occurred during the SARS-CoV-2 public health emergency.  Safety protocols were in place, including screening questions prior to the visit, additional usage of staff PPE, and extensive cleaning of exam room while observing appropriate contact time as indicated for disinfecting solutions.

## 2020-01-29 MED FILL — HYDROCHLOROTHIAZIDE 12.5 MG: 12.5 | 90 days supply | Qty: 90 | Fill #1

## 2020-08-22 MED FILL — HYDROCHLOROTHIAZIDE 12.5 MG: 12.5 | 90 days supply | Qty: 90 | Fill #2

## 2020-10-05 IMAGING — MG MM DIGITAL DIAGNOSTIC UNILAT*L* W/ TOMO W/ CAD
4 series · 4 of 12 positions shown · non-contrast
Comparison: Previous exam(s).

CLINICAL DATA: 63-year-old female for further evaluation of
possible LEFT breast asymmetry on screening mammogram.

EXAM:
DIGITAL DIAGNOSTIC UNILATERAL LEFT MAMMOGRAM WITH CAD AND TOMO

[L ML synth-2D]
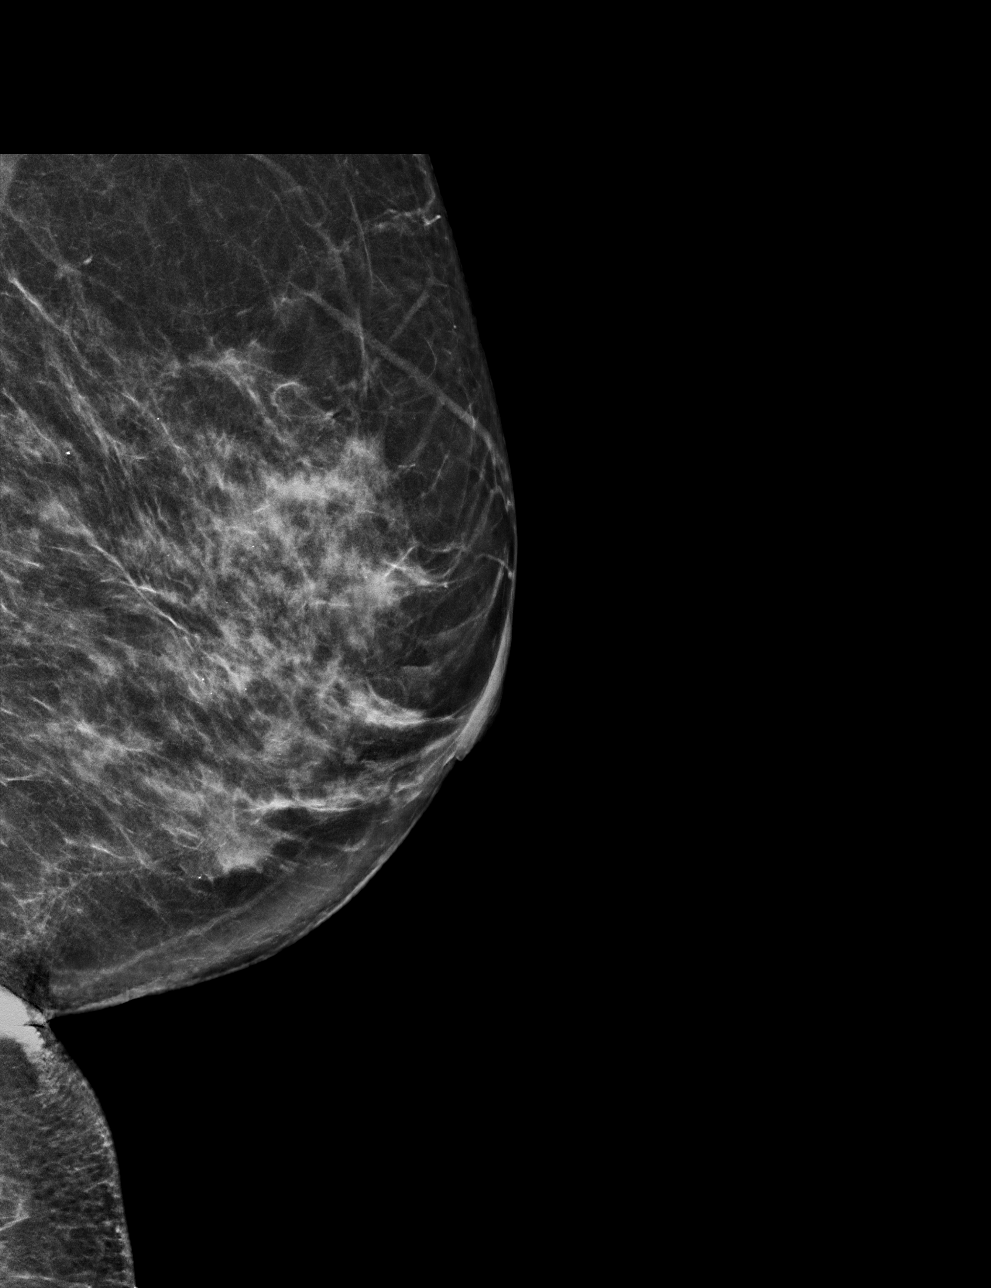

[L MLO synth-2D]
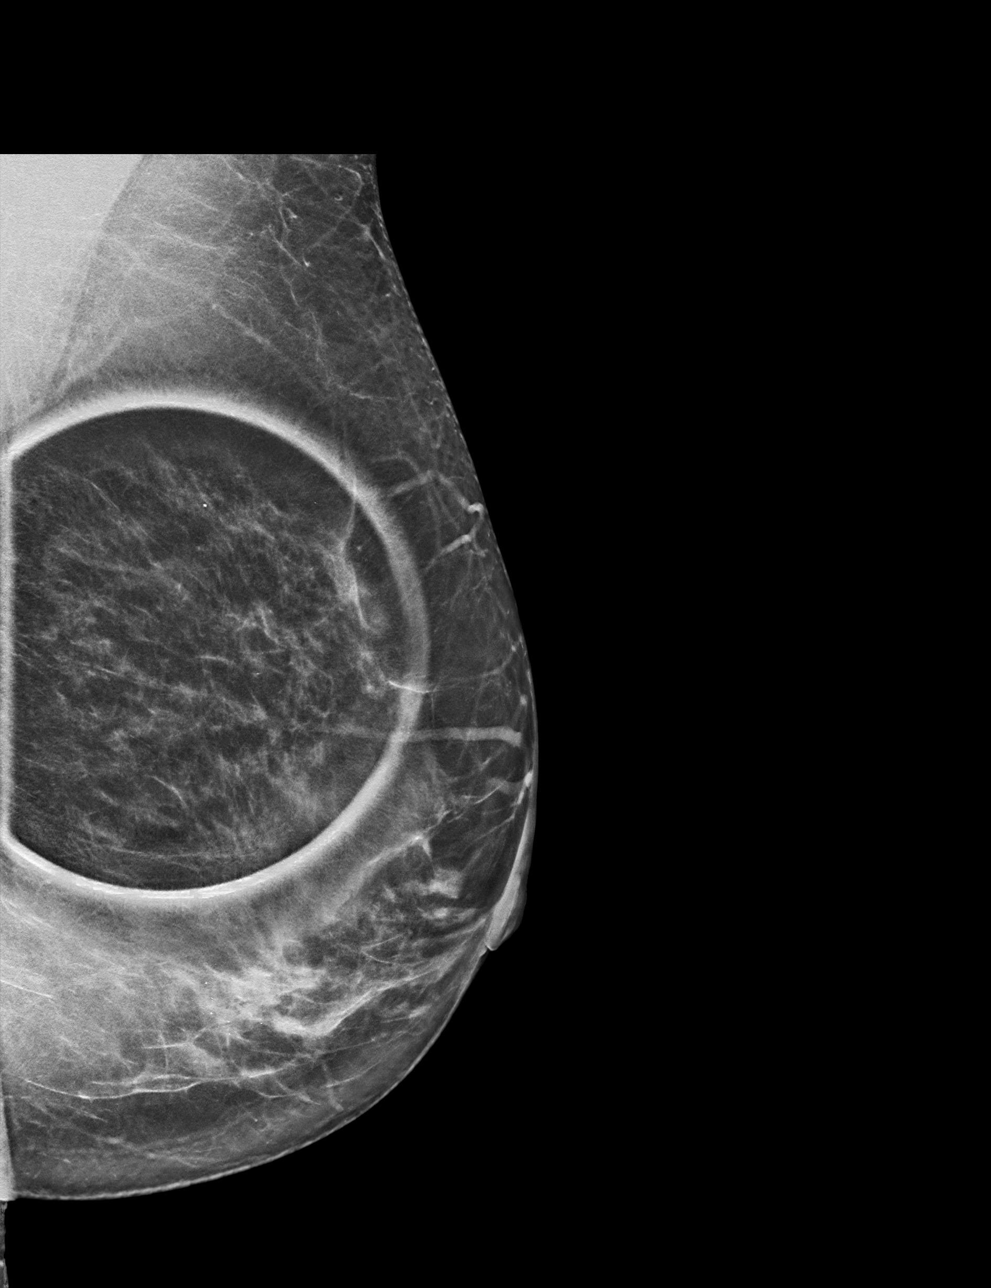

[L ML tomo · tomo slice 35/69.0]
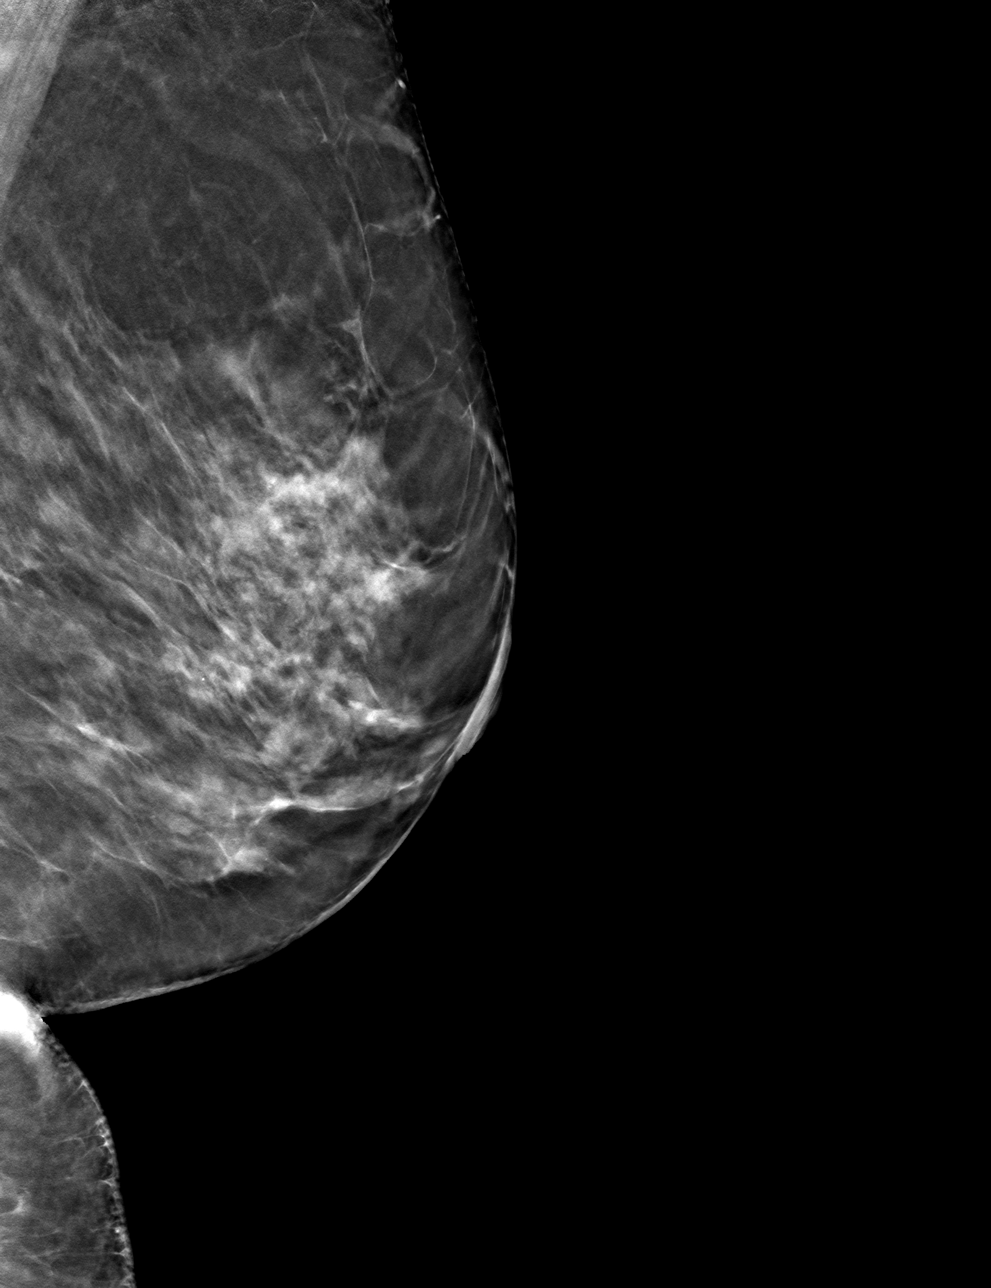

[L MLO tomo · tomo slice 35/68.0]
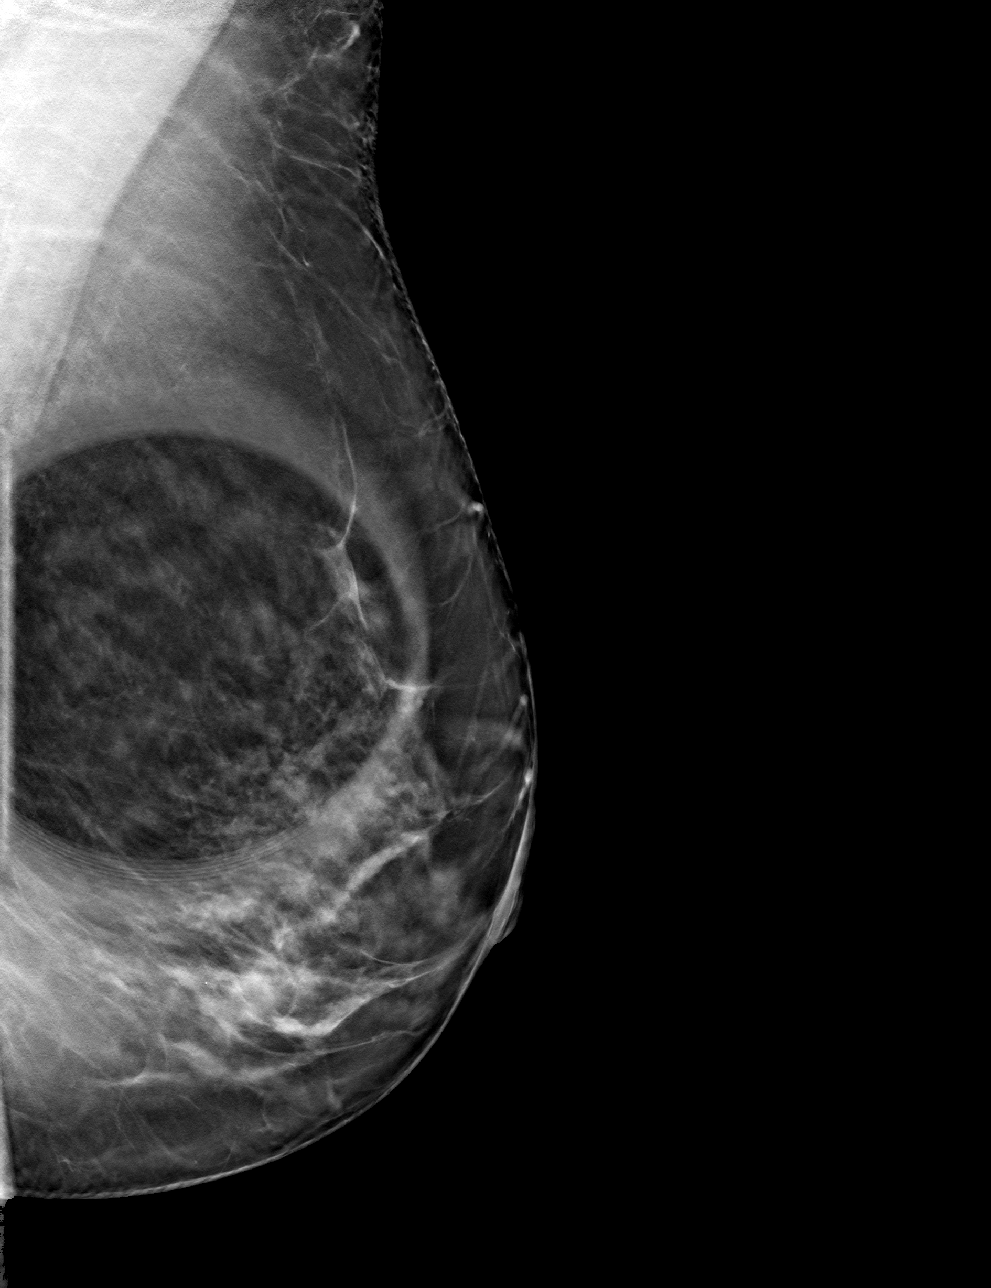

[4 of 12 positions shown; findings below may reference images not displayed]

ACR Breast Density Category c: The breast tissue is heterogeneously
dense, which may obscure small masses.
FINDINGS: 2D/3D full field and spot compression views of the LEFT breast
demonstrate no persistent abnormality at the site of the screening
study finding. No suspicious mass, distortion or worrisome
calcifications identified.

Mammographic images were processed with CAD.
IMPRESSION: No persistent abnormality at the site of the screening study
finding.

RECOMMENDATION:
Bilateral screening mammogram in 1 year.

I have discussed the findings and recommendations with the patient.
If applicable, a reminder letter will be sent to the patient
regarding the next appointment.

BI-RADS CATEGORY  1: Negative.

## 2020-11-03 ENCOUNTER — Encounter: Payer: Self-pay | Admitting: Gastroenterology

## 2020-12-16 ENCOUNTER — Other Ambulatory Visit: Payer: Self-pay | Admitting: Family Medicine

## 2020-12-16 DIAGNOSIS — Z1231 Encounter for screening mammogram for malignant neoplasm of breast: Secondary | ICD-10-CM

## 2020-12-19 ENCOUNTER — Other Ambulatory Visit: Payer: Self-pay | Admitting: Gastroenterology

## 2020-12-19 ENCOUNTER — Ambulatory Visit (AMBULATORY_SURGERY_CENTER): Payer: No Typology Code available for payment source

## 2020-12-19 ENCOUNTER — Other Ambulatory Visit: Payer: Self-pay

## 2020-12-19 VITALS — Ht 64.0 in | Wt 162.0 lb

## 2020-12-19 DIAGNOSIS — Z86010 Personal history of colonic polyps: Secondary | ICD-10-CM

## 2020-12-19 MED ORDER — NA SULFATE-K SULFATE-MG SULF 17.5-3.13-1.6 GM/177ML PO SOLN: 1.0000 | Freq: Once | ORAL | 0 refills | Status: DC

## 2020-12-19 NOTE — Progress Notes (Signed)
VIRTUAL PV  No egg or soy allergy known to patient  No issues with past sedation with any surgeries or procedures Patient denies ever being told they had issues or difficulty with intubation  No FH of Malignant Hyperthermia No diet pills per patient No home 02 use per patient  No blood thinners per patient  Pt denies issues with constipation  No A fib or A flutter  EMMI video to pt or via MyChart  COVID 19 guidelines implemented in PV today with Pt and RN  Pt is fully vaccinated  for Covid   Due to the COVID-19 pandemic we are asking patients to follow certain guidelines.  Pt aware of COVID protocols and [facility] guidelines   

## 2020-12-28 ENCOUNTER — Encounter: Payer: Self-pay | Admitting: Certified Registered Nurse Anesthetist

## 2020-12-29 ENCOUNTER — Encounter: Payer: Self-pay | Admitting: Gastroenterology

## 2020-12-29 ENCOUNTER — Ambulatory Visit (AMBULATORY_SURGERY_CENTER): Payer: No Typology Code available for payment source | Admitting: Gastroenterology

## 2020-12-29 ENCOUNTER — Other Ambulatory Visit: Payer: Self-pay

## 2020-12-29 VITALS — BP 136/88 | HR 74 | Temp 97.3°F | Resp 12 | Ht 64.0 in | Wt 162.0 lb

## 2020-12-29 DIAGNOSIS — D128 Benign neoplasm of rectum: Secondary | ICD-10-CM | POA: Diagnosis not present

## 2020-12-29 DIAGNOSIS — D127 Benign neoplasm of rectosigmoid junction: Secondary | ICD-10-CM | POA: Diagnosis not present

## 2020-12-29 DIAGNOSIS — D123 Benign neoplasm of transverse colon: Secondary | ICD-10-CM

## 2020-12-29 DIAGNOSIS — Z86010 Personal history of colonic polyps: Secondary | ICD-10-CM | POA: Diagnosis not present

## 2020-12-29 DIAGNOSIS — D125 Benign neoplasm of sigmoid colon: Secondary | ICD-10-CM

## 2020-12-29 DIAGNOSIS — D122 Benign neoplasm of ascending colon: Secondary | ICD-10-CM | POA: Diagnosis not present

## 2020-12-29 DIAGNOSIS — Z800 Family history of malignant neoplasm of digestive organs: Secondary | ICD-10-CM

## 2020-12-29 MED ORDER — SODIUM CHLORIDE 0.9 % IV SOLN: 500.0000 mL | Freq: Once | INTRAVENOUS | Status: DC

## 2020-12-29 NOTE — Progress Notes (Signed)
Called to room to assist during endoscopic procedure.  Patient ID and intended procedure confirmed with present staff. Received instructions for my participation in the procedure from the performing physician.  

## 2020-12-29 NOTE — Progress Notes (Signed)
Report given to PACU, vss 

## 2020-12-29 NOTE — Progress Notes (Signed)
No problems noted in the recovery room. maw 

## 2020-12-29 NOTE — Patient Instructions (Addendum)
Handouts were given to your care partner on polyps, diverticulosis, and hemorrhoids. You may resume your current medications today. Await biopsy results.  May take 1-3 weeks to receive pathology results. Please call if any questions or concerns.     YOU HAD AN ENDOSCOPIC PROCEDURE TODAY AT [facility]:   Refer to the procedure report that was given to you for any specific questions about what was found during the examination.  If the procedure report does not answer your questions, please call your gastroenterologist to clarify.  If you requested that your care partner not be given the details of your procedure findings, then the procedure report has been included in a sealed envelope for you to review at your convenience later.  YOU SHOULD EXPECT: Some feelings of bloating in the abdomen. Passage of more gas than usual.  Walking can help get rid of the air that was put into your GI tract during the procedure and reduce the bloating. If you had a lower endoscopy (such as a colonoscopy or flexible sigmoidoscopy) you may notice spotting of blood in your stool or on the toilet paper. If you underwent a bowel prep for your procedure, you may not have a normal bowel movement for a few days.  Please Note:  You might notice some irritation and congestion in your nose or some drainage.  This is from the oxygen used during your procedure.  There is no need for concern and it should clear up in a day or so.  SYMPTOMS TO REPORT IMMEDIATELY:   Following lower endoscopy (colonoscopy or flexible sigmoidoscopy):  Excessive amounts of blood in the stool  Significant tenderness or worsening of abdominal pains  Swelling of the abdomen that is new, acute  Fever of 100F or higher   For urgent or emergent issues, a gastroenterologist can be reached at any hour by calling [phone]. Do not use MyChart messaging for urgent concerns.    DIET:  We do recommend a small meal at first, but  then you may proceed to your regular diet.  Drink plenty of fluids but you should avoid alcoholic beverages for 24 hours.  ACTIVITY:  You should plan to take it easy for the rest of today and you should NOT DRIVE or use heavy machinery until tomorrow (because of the sedation medicines used during the test).    FOLLOW UP: Our staff will call the number listed on your records 48-72 hours following your procedure to check on you and address any questions or concerns that you may have regarding the information given to you following your procedure. If we do not reach you, we will leave a message.  We will attempt to reach you two times.  During this call, we will ask if you have developed any symptoms of COVID 19. If you develop any symptoms (ie: fever, flu-like symptoms, shortness of breath, cough etc.) before then, please call [phone].  If you test positive for Covid 19 in the 2 weeks post procedure, please call and report this information to [place].    If any biopsies were taken you will be contacted by phone or by letter within the next 1-3 weeks.  Please call us at [phone] if you have not heard about the biopsies in 3 weeks.    SIGNATURES/CONFIDENTIALITY: You and/or your care partner have signed paperwork which will be entered into your electronic medical record.  These signatures attest to the fact that that the information above on your After  Visit Summary has been reviewed and is understood.  Full responsibility of the confidentiality of this discharge information lies with you and/or your care-partner. 

## 2020-12-29 NOTE — Progress Notes (Signed)
Check-in-JB  Vital signs-[place]

## 2020-12-29 NOTE — Op Note (Signed)
[facility] Patient Name: Kathy Shaw Procedure Date: 12/29/2020 2:08 PM MRN: [id number] Endoscopist: Milus Banister , MD Age: 65 Referring MD:  Date of Birth: 1956/01/20 Gender: Female Account #: [id number] Procedure:                Colonoscopy Indications:              High risk colon cancer surveillance: Personal                            history of colonic polyps; Colonoscopy 2011 single                            subCM adenoma removed Medicines:                Monitored Anesthesia Care Procedure:                Pre-Anesthesia Assessment:                           - Prior to the procedure, a History and Physical                            was performed, and patient medications and                            allergies were reviewed. The patient's tolerance of                            previous anesthesia was also reviewed. The risks                            and benefits of the procedure and the sedation                            options and risks were discussed with the patient.                            All questions were answered, and informed consent                            was obtained. Prior Anticoagulants: The patient has                            taken no previous anticoagulant or antiplatelet                            agents. ASA Grade Assessment: II - A patient with                            mild systemic disease. After reviewing the risks                            and benefits, the patient was deemed in  satisfactory condition to undergo the procedure.                           After obtaining informed consent, the colonoscope                            was passed under direct vision. Throughout the                            procedure, the patient's blood pressure, pulse, and                            oxygen saturations were monitored continuously. The                            Olympus PFC-[id number] (#[id number]) Colonoscope  was                            introduced through the anus and advanced to the the                            cecum, identified by appendiceal orifice and                            ileocecal valve. The colonoscopy was performed                            without difficulty. The patient tolerated the                            procedure well. The quality of the bowel                            preparation was good. The ileocecal valve,                            appendiceal orifice, and rectum were photographed. Scope In: 2:13:22 PM Scope Out: 2:27:50 PM Scope Withdrawal Time: 0 hours 11 minutes 22 seconds  Total Procedure Duration: 0 hours 14 minutes 28 seconds  Findings:                 Six sessile polyps were found in the rectum,                            sigmoid colon, transverse colon and ascending                            colon. The polyps were 2 to 8 mm in size. These                            polyps were removed with a cold snare. Resection                            and retrieval were complete.  Multiple small and large-mouthed diverticula were                            found in the left colon.                           Internal hemorrhoids were found. The hemorrhoids                            were small.                           The exam was otherwise without abnormality on                            direct and retroflexion views. Complications:            No immediate complications. Estimated blood loss:                            None. Estimated Blood Loss:     Estimated blood loss: none. Impression:               - Six 2 to 8 mm polyps in the rectum, in the                            sigmoid colon, in the transverse colon and in the                            ascending colon, removed with a cold snare.                            Resected and retrieved.                           - Diverticulosis in the left colon.                           -  Internal hemorrhoids.                           - The examination was otherwise normal on direct                            and retroflexion views. Recommendation:           - Patient has a contact number available for                            emergencies. The signs and symptoms of potential                            delayed complications were discussed with the                            patient. Return to normal activities tomorrow.  Written discharge instructions were provided to the                            patient.                           - Resume previous diet.                           - Continue present medications.                           - Await pathology results. Milus Banister, MD 12/29/2020 2:30:25 PM This report has been signed electronically.

## 2020-12-31 ENCOUNTER — Telehealth: Payer: Self-pay

## 2020-12-31 ENCOUNTER — Telehealth: Payer: Self-pay | Admitting: *Deleted

## 2020-12-31 NOTE — Telephone Encounter (Signed)
  Follow up Call-  Call back number 12/29/2020  Post procedure Call Back phone  # [phone]  Permission to leave phone message Yes  Some recent data might be hidden     Patient questions:  Do you have a fever, pain , or abdominal swelling? No. Pain Score  0 *  Have you tolerated food without any problems? Yes.    Have you been able to return to your normal activities? Yes.    Do you have any questions about your discharge instructions: Diet   No. Medications  No. Follow up visit  No.  Do you have questions or concerns about your Care? No.  Actions: * If pain score is 4 or above: No action needed, pain <4. 1. Have you developed a fever since your procedure? no  2.   Have you had an respiratory symptoms (SOB or cough) since your procedure? no  3.   Have you tested positive for COVID 19 since your procedure no  4.   Have you had any family members/close contacts diagnosed with the COVID 19 since your procedure?  no   If yes to any of these questions please route to Joylene John, RN and Joella Prince, RN

## 2020-12-31 NOTE — Telephone Encounter (Signed)
  Follow up Call-  Call back number 12/29/2020  Post procedure Call Back phone  # [phone]  Permission to leave phone message Yes  Some recent data might be hidden     Patient questions:  Do you have a fever, pain , or abdominal swelling? No. Pain Score  0 *  Have you tolerated food without any problems? Yes.    Have you been able to return to your normal activities? Yes.    Do you have any questions about your discharge instructions: Diet   No. Medications  No. Follow up visit  No.  Do you have questions or concerns about your Care? No.  Actions: * If pain score is 4 or above: No action needed, pain <4.  1. Have you developed a fever since your procedure? no  2.   Have you had an respiratory symptoms (SOB or cough) since your procedure? no  3.   Have you tested positive for COVID 19 since your procedure no  4.   Have you had any family members/close contacts diagnosed with the COVID 19 since your procedure?  no   If yes to any of these questions please route to Joylene John, RN and Joella Prince, RN

## 2021-01-06 ENCOUNTER — Encounter: Payer: Self-pay | Admitting: Gastroenterology

## 2021-01-07 ENCOUNTER — Encounter: Payer: Self-pay | Admitting: Family Medicine

## 2021-01-07 NOTE — Progress Notes (Signed)
Reviewed report/notes and updated pt's chart/history/PL and/or HM accordingly. 

## 2021-01-14 ENCOUNTER — Ambulatory Visit: Payer: No Typology Code available for payment source | Admitting: Family Medicine

## 2021-01-14 DIAGNOSIS — Z1231 Encounter for screening mammogram for malignant neoplasm of breast: Secondary | ICD-10-CM

## 2021-02-02 ENCOUNTER — Ambulatory Visit
Admission: RE | Admit: 2021-02-02 | Discharge: 2021-02-02 | Disposition: A | Discharge status: Home / Self Care | Payer: No Typology Code available for payment source | Source: Ambulatory Visit

## 2021-02-02 ENCOUNTER — Other Ambulatory Visit: Payer: Self-pay

## 2021-02-02 DIAGNOSIS — Z1231 Encounter for screening mammogram for malignant neoplasm of breast: Secondary | ICD-10-CM

## 2021-02-25 ENCOUNTER — Other Ambulatory Visit (HOSPITAL_BASED_OUTPATIENT_CLINIC_OR_DEPARTMENT_OTHER): Payer: Self-pay

## 2021-02-25 ENCOUNTER — Encounter: Payer: Self-pay | Admitting: Family Medicine

## 2021-02-25 ENCOUNTER — Other Ambulatory Visit (HOSPITAL_COMMUNITY): Payer: Self-pay

## 2021-02-25 ENCOUNTER — Ambulatory Visit (INDEPENDENT_AMBULATORY_CARE_PROVIDER_SITE_OTHER): Payer: No Typology Code available for payment source | Admitting: Family Medicine

## 2021-02-25 ENCOUNTER — Other Ambulatory Visit: Payer: Self-pay

## 2021-02-25 VITALS — BP 148/90 | HR 67 | Temp 98.3°F | Ht 64.0 in | Wt 163.2 lb

## 2021-02-25 DIAGNOSIS — D369 Benign neoplasm, unspecified site: Secondary | ICD-10-CM | POA: Diagnosis not present

## 2021-02-25 DIAGNOSIS — I10 Essential (primary) hypertension: Secondary | ICD-10-CM | POA: Diagnosis not present

## 2021-02-25 DIAGNOSIS — Z79899 Other long term (current) drug therapy: Secondary | ICD-10-CM | POA: Diagnosis not present

## 2021-02-25 DIAGNOSIS — K219 Gastro-esophageal reflux disease without esophagitis: Secondary | ICD-10-CM

## 2021-02-25 DIAGNOSIS — Z808 Family history of malignant neoplasm of other organs or systems: Secondary | ICD-10-CM

## 2021-02-25 DIAGNOSIS — Z0000 Encounter for general adult medical examination without abnormal findings: Secondary | ICD-10-CM

## 2021-02-25 LAB — LIPID PANEL
Cholesterol: 219 mg/dL — ABNORMAL HIGH (ref 0–200)
HDL: 102.9 mg/dL (ref >39.00)
LDL Cholesterol: 96 mg/dL (ref 0–99)
NonHDL: 115.66
Total CHOL/HDL Ratio: 2
Triglycerides: 97 mg/dL (ref 0.0–149.0)
VLDL: 19.4 mg/dL (ref 0.0–40.0)

## 2021-02-25 LAB — COMPREHENSIVE METABOLIC PANEL
ALT: 15 U/L (ref 0–35)
AST: 15 U/L (ref 0–37)
Albumin: 4.2 g/dL (ref 3.5–5.2)
Alkaline Phosphatase: 42 U/L (ref 39–117)
BUN: 9 mg/dL (ref 6–23)
CO2: 28 mEq/L (ref 19–32)
Calcium: 9.4 mg/dL (ref 8.4–10.5)
Chloride: 101 mEq/L (ref 96–112)
Creatinine, Ser: 0.78 mg/dL (ref 0.40–1.20)
GFR: 80.13 mL/min (ref >60.00)
Glucose, Bld: 98 mg/dL (ref 70–99)
Potassium: 3.9 mEq/L (ref 3.5–5.1)
Sodium: 139 mEq/L (ref 135–145)
Total Bilirubin: 0.5 mg/dL (ref 0.2–1.2)
Total Protein: 6.7 g/dL (ref 6.0–8.3)

## 2021-02-25 LAB — CBC WITH DIFFERENTIAL/PLATELET
Basophils Absolute: 0 10*3/uL (ref 0.0–0.1)
Basophils Relative: 0.5 % (ref 0.0–3.0)
Eosinophils Absolute: 0.1 10*3/uL (ref 0.0–0.7)
Eosinophils Relative: 2.8 % (ref 0.0–5.0)
HCT: 40.6 % (ref 36.0–46.0)
Hemoglobin: 13.5 g/dL (ref 12.0–15.0)
Lymphocytes Relative: 31.6 % (ref 12.0–46.0)
Lymphs Abs: 1.7 10*3/uL (ref 0.7–4.0)
MCHC: 33.2 g/dL (ref 30.0–36.0)
MCV: 97.1 fl (ref 78.0–100.0)
Monocytes Absolute: 0.6 10*3/uL (ref 0.1–1.0)
Monocytes Relative: 11.5 % (ref 3.0–12.0)
Neutro Abs: 2.9 10*3/uL (ref 1.4–7.7)
Neutrophils Relative %: 53.6 % (ref 43.0–77.0)
Platelets: 345 10*3/uL (ref 150.0–400.0)
RBC: 4.19 Mil/uL (ref 3.87–5.11)
RDW: 13.9 % (ref 11.5–15.5)
WBC: 5.4 10*3/uL (ref 4.0–10.5)

## 2021-02-25 LAB — VITAMIN B12: Vitamin B-12: 171 pg/mL — ABNORMAL LOW (ref 211–911)

## 2021-02-25 LAB — TSH: TSH: 2.15 u[IU]/mL (ref 0.35–4.50)

## 2021-02-25 MED ORDER — LISINOPRIL-HYDROCHLOROTHIAZIDE 10-12.5 MG PO TABS
1.0000 | ORAL_TABLET | Freq: Every day | ORAL | 3 refills | Status: DC
  Filled 2021-02-25: qty 90, 90d supply, fill #0

## 2021-02-25 MED ORDER — LISINOPRIL-HYDROCHLOROTHIAZIDE 10-12.5 MG PO TABS
1.0000 | ORAL_TABLET | Freq: Every day | ORAL | 3 refills | Status: DC
  Filled 2021-02-25: qty 90, 90d supply, fill #0
  Filled 2021-06-01: qty 90, 90d supply, fill #1

## 2021-02-25 NOTE — Progress Notes (Signed)
Subjective  Chief Complaint  Patient presents with  . Annual Exam    Had coffee with creamer.  . Hypertension  . Gastroesophageal Reflux    HPI: Kathy Shaw is a 65 y.o. female who presents to [facility] today for a Female Wellness Visit. She also has the concerns and/or needs as listed above in the chief complaint. These will be addressed in addition to the Health Maintenance Visit.   Wellness Visit: annual visit with health maintenance review and exam without Pap   Health maintenance: Screens are up-to-date.  Had mammogram last week and was normal.  She does need a skin cancer check and she will schedule this with her dermatology.  She has a family history of melanoma.  Colonoscopies are up-to-date.  Overall she feels great.  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  Immunizations are up-to-date   Chronic disease f/u and/or acute problem visit: (deemed necessary to be done in addition to the wellness visit):  Hypertension: On HCTZ 12.5 daily, intermittent home checks from borderline high.  Elevated in the office today.  No chest pain or shortness of breath.  No lower extremity edema.  Tolerates her medications well.  Fairly healthy lifestyle.  Tubular adenoma: Getting colonoscopies every 3 to 5 years.  GERD is well controlled on omeprazole 20 mg daily.  No breakthrough symptoms.  No melena.  Assessment  1. Annual physical exam   2. Essential hypertension   3. Tubular adenoma   4. Family history of thyroid cancer   5. GERD without esophagitis   6. Long-term current use of proton pump inhibitor therapy      Plan  Female Wellness Visit:  Age appropriate Health Maintenance and Prevention measures were discussed with patient. Included topics are cancer screening recommendations, ways to keep healthy (see AVS) including dietary and exercise recommendations, regular eye and dental care, use of seat belts, and  avoidance of moderate alcohol use and tobacco use.   BMI: discussed patient's BMI and encouraged positive lifestyle modifications to help get to or maintain a target BMI.  HM needs and immunizations were addressed and ordered. See below for orders. See HM and immunization section for updates.  Routine labs and screening tests ordered including cmp, cbc and lipids where appropriate.  Discussed recommendations regarding Vit D and calcium supplementation (see AVS)  Chronic disease management visit and/or acute problem visit:  Hypertension: Marginally controlled.  Change medication: Start lisinopril/hctz 10/12.5 1 tab daily.  Patient to monitor for side effect of cough.  She will check her blood pressure at work: Educated goal 120s over 27s.  Check renal function electrolytes today.  Long-term PPI controlling GERD well.  Screen for B12 deficiency.  Family history of thyroid cancer, check TSH.  No lumps in the neck palpated today.  Tubular adenoma: Colonoscopy screens are up-to-date.   Follow up: 6 months to recheck blood pressure Orders Placed This Encounter  Procedures  . CBC with Differential/Platelet  . Comprehensive metabolic panel  . Lipid panel  . TSH  . Vitamin B12   Meds ordered this encounter  Medications  . lisinopril-hydrochlorothiazide (ZESTORETIC) 10-12.5 MG tablet    Sig: Take 1 tablet by mouth daily.    Dispense:  90 tablet    Refill:  3      Body mass index is 28.01 kg/m. Wt Readings from Last 3 Encounters:  02/25/21 163 lb 3.2 oz (74 kg)  12/29/20 162 lb (73.5 kg)  12/19/20 162 lb (73.5 kg)     Patient Active Problem List   Diagnosis Date Noted  . Tubular adenoma 01/14/2020    Dr. Ardis Hughs, colonoscopy 2010;  2022 tubular adenoma and serrated polyps; recall in 2025   . Essential hypertension 09/18/2018  . GERD without esophagitis 09/18/2018  . Family history of colon cancer 09/16/2017    Maternal aunt   . Family history of malignant melanoma  09/16/2017  . Family history of thyroid cancer 09/16/2017    Mother   . Family history of breast cancer in mother    Health Maintenance  Topic Date Due  . COVID-19 Vaccine (3 - Booster for Pfizer series) 03/21/2020  . INFLUENZA VACCINE  05/04/2021  . MAMMOGRAM  02/02/2022  . PAP SMEAR-Modifier  09/16/2022  . COLONOSCOPY (Pts 45-87yrs Insurance coverage will need to be confirmed)  12/30/2023  . TETANUS/TDAP  06/28/2027  . Hepatitis C Screening  Completed  . HIV Screening  Completed  . HPV VACCINES  Aged Out   Immunization History  Administered Date(s) Administered  . Influenza-Unspecified 07/01/2017, 06/27/2019, 08/04/2020  . PFIZER(Purple Top)SARS-COV-2 Vaccination 09/30/2019, 10/22/2019  . Tdap 06/27/2017  . Zoster Recombinat (Shingrix) 09/18/2018, 01/14/2020   We updated and reviewed the patient's past history in detail and it is documented below. Allergies: Patient has No Known Allergies. Past Medical History Patient  has a past medical history of Allergy, Family history of breast cancer in mother, Family history of colon cancer (09/16/2017), GERD (gastroesophageal reflux disease), Heart murmur, History of chicken pox, History of tear of ACL (anterior cruciate ligament), and Hypertension. Past Surgical History Patient  has a past surgical history that includes Knee arthroscopy with anterior cruciate ligament (acl) repair (Left, 2005); Breast surgery (Left); Tonsillectomy (1961); Wisdom tooth extraction (Bilateral); Breast excisional biopsy (Left, 2008); Colonoscopy (08/2009-DJ); and Polypectomy. Family History: Patient family history includes Breast cancer in her maternal grandmother and mother; Cancer in her mother; Colon cancer in her maternal aunt; Diabetes in her father; Healthy in her daughter, daughter, sister, sister, sister, and son; Heart disease in her father; Hypertension in her father; Melanoma in her mother; Myasthenia gravis in her father; Thyroid cancer in her  mother. Social History:  Patient  reports that she has quit smoking. Her smoking use included cigarettes. She has a 1.50 pack-year smoking history. She has never used smokeless tobacco. She reports current alcohol use of about 14.0 standard drinks of alcohol per week. She reports that she does not use drugs.  Review of Systems: Constitutional: negative for fever or malaise Ophthalmic: negative for photophobia, double vision or loss of vision Cardiovascular: negative for chest pain, dyspnea on exertion, or new LE swelling Respiratory: negative for SOB or persistent cough Gastrointestinal: negative for abdominal pain, change in bowel habits or melena Genitourinary: negative for dysuria or gross hematuria, no abnormal uterine bleeding or disharge Musculoskeletal: negative for new gait disturbance or muscular weakness Integumentary: negative for new or persistent rashes, no breast lumps Neurological: negative for TIA or stroke symptoms Psychiatric: negative for SI or delusions Allergic/Immunologic: negative for hives  Patient Care Team    Relationship Specialty Notifications Start End  Leamon Arnt, MD PCP - General Family Medicine  09/16/17   Milus Banister, MD Consulting Physician Gastroenterology  01/14/20     Objective  Vitals: BP (!) 148/90   Pulse 67   Temp 98.3 F (36.8 C) (Temporal)   Ht 5\' 4"  (1.626 m)   Wt 163 lb  3.2 oz (74 kg)   SpO2 95%   BMI 28.01 kg/m  General:  Well developed, well nourished, no acute distress  Psych:  Alert and orientedx3,normal mood and affect HEENT:  Normocephalic, atraumatic, non-icteric sclera,  supple neck without adenopathy, mass or thyromegaly Cardiovascular:  Normal S1, S2, RRR without gallop, rub or murmur Respiratory:  Good breath sounds bilaterally, CTAB with normal respiratory effort Gastrointestinal: normal bowel sounds, soft, non-tender, no noted masses. No HSM MSK: no deformities, contusions. Joints are without erythema or  swelling.  Skin:  Warm, no rashes or suspicious lesions noted Neurologic:    Mental status is normal. CN 2-11 are normal. Gross motor and sensory exams are normal. Normal gait. No tremor    Commons side effects, risks, benefits, and alternatives for medications and treatment plan prescribed today were discussed, and the patient expressed understanding of the given instructions. Patient is instructed to call or message via MyChart if he/she has any questions or concerns regarding our treatment plan. No barriers to understanding were identified. We discussed Red Flag symptoms and signs in detail. Patient expressed understanding regarding what to do in case of urgent or emergency type symptoms.   Medication list was reconciled, printed and provided to the patient in AVS. Patient instructions and summary information was reviewed with the patient as documented in the AVS. This note was prepared with assistance of Dragon voice recognition software. Occasional wrong-word or sound-a-like substitutions may have occurred due to the inherent limitations of voice recognition software  This visit occurred during the SARS-CoV-2 public health emergency.  Safety protocols were in place, including screening questions prior to the visit, additional usage of staff PPE, and extensive cleaning of exam room while observing appropriate contact time as indicated for disinfecting solutions.

## 2021-02-25 NOTE — Patient Instructions (Signed)
Please return in 6 months for hypertension follow up.  I will release your lab results to you on your MyChart account with further instructions. Please reply with any questions.   I have changed her blood pressure medicine.  Please check your blood pressures at home or at work.  We would like them to be 120/70 consistently.  Let me know if you have any problem with the new medication.  Congratulations on your upcoming retirement! If you have any questions or concerns, please don't hesitate to send me a message via MyChart or call the office at [phone]. Thank you for visiting with [place] today! It's our pleasure caring for you.

## 2021-06-01 ENCOUNTER — Other Ambulatory Visit (HOSPITAL_BASED_OUTPATIENT_CLINIC_OR_DEPARTMENT_OTHER): Payer: Self-pay

## 2021-08-13 ENCOUNTER — Ambulatory Visit: Payer: PPO | Admitting: Orthopaedic Surgery

## 2021-08-13 ENCOUNTER — Ambulatory Visit: Payer: PPO | Attending: Orthopaedic Surgery | Admitting: Occupational Therapy

## 2021-08-13 ENCOUNTER — Encounter: Payer: Self-pay | Admitting: Orthopaedic Surgery

## 2021-08-13 ENCOUNTER — Encounter: Payer: Self-pay | Admitting: Occupational Therapy

## 2021-08-13 ENCOUNTER — Ambulatory Visit (INDEPENDENT_AMBULATORY_CARE_PROVIDER_SITE_OTHER): Payer: PPO

## 2021-08-13 ENCOUNTER — Other Ambulatory Visit: Payer: Self-pay

## 2021-08-13 DIAGNOSIS — M25341 Other instability, right hand: Secondary | ICD-10-CM | POA: Diagnosis not present

## 2021-08-13 DIAGNOSIS — M79644 Pain in right finger(s): Secondary | ICD-10-CM | POA: Insufficient documentation

## 2021-08-13 DIAGNOSIS — S63654A Sprain of metacarpophalangeal joint of right ring finger, initial encounter: Secondary | ICD-10-CM

## 2021-08-13 DIAGNOSIS — R600 Localized edema: Secondary | ICD-10-CM | POA: Insufficient documentation

## 2021-08-13 DIAGNOSIS — S63659A Sprain of metacarpophalangeal joint of unspecified finger, initial encounter: Secondary | ICD-10-CM

## 2021-08-13 NOTE — Progress Notes (Signed)
Office Visit Note   Patient: Kathy Shaw           Date of Birth: 01/23/1956           MRN: [id number] Visit Date: 08/13/2021              Requested by: Leamon Arnt, [address],  [place] [address] PCP: Leamon Arnt, MD   Assessment & Plan: Visit Diagnoses:  1. 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  We went over this condition and she does have full function with some slight pain to flexion.  We will place her in an extension splint to be worn for 8 weeks.  She may take off for hygiene.  I would like to recheck her in 8 weeks.  Follow-Up Instructions: Return in about 8 weeks (around 10/08/2021).   Orders:  Orders Placed This Encounter  Procedures   XR Finger Middle Right   No orders of the defined types were placed in this encounter.     Procedures: No procedures performed   Clinical Data: No additional findings.   Subjective: Chief Complaint  Patient presents with   Right Middle Finger - Pain    Kathy Shaw is a very pleasant 65 year old female mother of Kathy Shaw who comes in for acute injury to her right long finger.  She was flicking her granddaughters foot 2 days ago and felt immediate pain and pop over the MCP joint.  She then noticed popping and snapping of the extensor tendon to the ulnar side.  She has tenderness and swelling over the MCP joint.   Review of Systems  Constitutional: Negative.   HENT: Negative.    Eyes: Negative.   Respiratory: Negative.    Cardiovascular: Negative.   Endocrine: Negative.   Musculoskeletal: Negative.   Neurological: Negative.   Hematological: Negative.   Psychiatric/Behavioral: Negative.    All other systems reviewed and are negative.   Objective: Vital Signs: There were no vitals taken for this visit.  Physical Exam Vitals and nursing note reviewed.  Constitutional:       Appearance: She is well-developed.  Pulmonary:     Effort: Pulmonary effort is normal.  Skin:    General: Skin is warm.     Capillary Refill: Capillary refill takes less than 2 seconds.  Neurological:     Mental Status: She is alert and oriented to person, place, and time.  Psychiatric:        Behavior: Behavior normal.        Thought Content: Thought content normal.        Judgment: Judgment normal.    Ortho Exam  Right hand exam shows mild swelling to the radial aspect of the third metacarpal head on the dorsal side.  Slight ulnar subluxation of the extensor tendon with flexion of the MCP joint.  She is able to fully flex and extend the MCP joint.  She is tender over the radial sagittal band.  No neurovascular compromise.  IP joints are asymptomatic.  Specialty Comments:  No specialty comments available.  Imaging: XR Finger Middle Right  Result Date: 08/13/2021 No acute or structural abnormalities.    PMFS History: Patient Active Problem List   Diagnosis Date Noted   Tubular adenoma 01/14/2020   Essential hypertension 09/18/2018   GERD without esophagitis 09/18/2018   Family history of colon cancer 09/16/2017   Family history  of malignant melanoma 09/16/2017   Family history of thyroid cancer 09/16/2017   Family history of breast cancer in mother    Past Medical History:  Diagnosis Date   Allergy    Family history of breast cancer in mother    Family history of colon cancer 09/16/2017   Maternal aunt   GERD (gastroesophageal reflux disease)    Heart murmur    History of chicken pox    History of tear of ACL (anterior cruciate ligament)    left   Hypertension     Family History  Problem Relation Age of Onset   Breast cancer Mother    Thyroid cancer Mother    Melanoma Mother    Cancer Mother    Myasthenia gravis Father    Hypertension Father    Diabetes Father    Heart disease Father    Healthy Sister    Healthy Daughter    Healthy Son    Colon cancer  Maternal Aunt    Breast cancer Maternal Grandmother    Healthy Sister    Healthy Sister    Healthy Daughter    Esophageal cancer Neg Hx    Rectal cancer Neg Hx    Stomach cancer Neg Hx     Past Surgical History:  Procedure Laterality Date   BREAST EXCISIONAL BIOPSY Left 2008   Dr. Margot Chimes   BREAST SURGERY Left    Atypical cells removed   COLONOSCOPY  08/2009-DJ   KNEE ARTHROSCOPY WITH ANTERIOR CRUCIATE LIGAMENT (ACL) REPAIR Left 2005   POLYPECTOMY     TONSILLECTOMY  1961   WISDOM TOOTH EXTRACTION Bilateral    Social History   Occupational History   Occupation: Therapist, sports, NICU    Employer: [employer]  Tobacco Use   Smoking status: Former    Packs/day: 0.50    Years: 3.00    Pack years: 1.50    Types: Cigarettes   Smokeless tobacco: Never   Tobacco comments:    quit 1981  Vaping Use   Vaping Use: Never used  Substance and Sexual Activity   Alcohol use: Yes    Alcohol/week: 14.0 standard drinks    Types: 14 Glasses of wine per week   Drug use: No   Sexual activity: Yes    Birth control/protection: None, Post-menopausal

## 2021-08-14 NOTE — Therapy (Signed)
[place] [place], [place], [address] Phone: [phone]   Fax:  [phone]  Occupational Therapy Evaluation  Patient Details  Name: Kathy Shaw MRN: [id number] Date of Birth: 06-Apr-1956 Referring Provider (OT): Frankey Shown, MD   Encounter Date: 08/13/2021   OT End of Session - 08/13/21 1457     Visit Number 1    Authorization Type [employer]    Authorization Time Period VL: [place]    OT Start Time 1400    OT Stop Time 1446    OT Time Calculation (min) 46 min    Activity Tolerance Patient tolerated treatment well    Behavior During Therapy [facility] for tasks assessed/performed            Past Medical History:  Diagnosis Date   Allergy    Family history of breast cancer in mother    Family history of colon cancer 09/16/2017   Maternal aunt   GERD (gastroesophageal reflux disease)    Heart murmur    History of chicken pox    History of tear of ACL (anterior cruciate ligament)    left   Hypertension     Past Surgical History:  Procedure Laterality Date   BREAST EXCISIONAL BIOPSY Left 2008   Dr. Margot Chimes   BREAST SURGERY Left    Atypical cells removed   COLONOSCOPY  08/2009-DJ   KNEE ARTHROSCOPY WITH ANTERIOR CRUCIATE LIGAMENT (ACL) REPAIR Left 2005   POLYPECTOMY     TONSILLECTOMY  1961   WISDOM TOOTH EXTRACTION Bilateral     There were no vitals filed for this visit.   Subjective Assessment - 08/13/21 1407     Subjective  Pt arrives to session w/ primary concerns regarding pain of her R long finger s/p sagittal band rupture. Pt reports she was with her granddaughter on Tuesday (16/5/53) and flicked her long finger, immediately noticing a pop and increased pain. She then noticed her finger kept "snapping to the side; since then has experienced persistent tenderness and swelling over the MCP joint.    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 Retired this year; Press photographer      ADL   ADL comments Reports Mod I for all ADLs      Observation/Other Assessments  Quick DASH  38.6      Sensation   Light Touch Appears Intact    Hot/Cold Appears Intact      Edema   Edema Moderate edema around MCPJ of R long finger on dorsal side      ROM / Strength   AROM / PROM / Strength AROM;Strength      Palpation   Palpation comment Mild tenderness to palpation      AROM   Overall AROM  Deficits;Due to pain;Due to precautions    AROM Assessment Site Finger    Right/Left Finger Right    Right Composite Finger Extension 50%    Right Composite Finger Flexion 50%      Hand Function   Right Hand Gross Grasp Impaired   Due to pain; due to precautions            OT Treatments/Exercises (OP) - 08/13/21 1446       Splinting   Splinting Fabricated and fitted custom relative motion/yoke orthosis using 1/8" solid TailorSplint material. Pt positioned w/ R long finger in approximately 15 degrees less relative motion than adjacent MCPJs; OT included additional volar-sided gutter  for increased stability of long finger. Pt instructed to wear orthosis at all times except for hygiene care; able to don and doff orthosis independently.             OT Education - 08/13/21 1457     Education Details Education provided on purpose of orthosis, wear and care, as well as potential signs and symptoms of irritation or inadequate fit to be aware of; handout reviewed and administered to pt. Also reviewed appropriate edema management strategies.    Person(s) Educated Patient    Methods Explanation;Handout    Comprehension Verbalized understanding             OT Short Term Goals - 08/13/21 1521       OT SHORT TERM GOAL #1   Title Pt will demonstrate/verbalize understanding of wear and care of custom-fabricated orthosis    Baseline Currently wearing prefabricated splint    Status Achieved   08/13/21 - verbalizes understanding of orthosis wear/care; demonstrates donning/doffing of orthosis Ind   Target Date 08/13/21      OT SHORT TERM GOAL #2   Title Pt will verbalize understanding of at least 2 edema/pain management strategies    Status Achieved   08/13/21 - OTC medication, cold pack, elevation   Target Date 08/13/21             Plan - 08/13/21 1459     Clinical Impression Statement Pt is a 65 y.o. female who presents to OP OT 2 days s/p R long finger saggital band rupture at MCPJ on 08/11/21. Since her injury, pt has been in prefabricated hand-based finger extension orthosis and was referred to OT by Dr. Erlinda Hong for fabrication of a custom-fabricated finger extension orthosis to prevent movement, protect injury, and facilitate healing for at least 8 weeks. A relative motion/yoke orthosis was selected due to positioning of long finger in less relative motion than adjacent MPJs reducing tension and force across affected saggital band. Additionally, pt's daughter is Kathy Shaw, PT of [facility], who reached out to OT prior to session and requested this same  orthosis; pt is in agreement and verbalized understanding of POC at this time.    OT Occupational Profile and History Problem Focused Assessment - Including review of records relating to presenting problem    Occupational performance deficits (Please  refer to evaluation for details): ADL's;IADL's;Rest and Sleep;Leisure    Body Structure / Function / Physical Skills Decreased knowledge of precautions;Pain;Edema;UE functional use;Body mechanics;Strength    Rehab Potential Excellent    Clinical Decision Making Limited treatment options, no task modification necessary    Comorbidities Affecting Occupational Performance: May have comorbidities impacting occupational performance    Modification or Assistance to Complete Evaluation  No modification of tasks or assist necessary to complete eval    OT Frequency One time visit    OT Treatment/Interventions Splinting;Patient/family education    Plan Finger extension orthosis to be worn for at least 8 weeks; may require additional visit after this time for individualized HEP prn. Pt encouraged to call back w/ any questions/concerns.    Consulted and Agree with Plan of Care Patient            Patient will benefit from skilled therapeutic intervention in order to improve the following deficits and impairments:   Body Structure / Function / Physical Skills: Decreased knowledge of precautions, Pain, Edema, UE functional use, Body mechanics, Strength   Visit Diagnosis: Pain in right finger(s)  Localized edema  Other instability, right hand   Problem List Patient Active Problem List   Diagnosis Date Noted   Tubular adenoma 01/14/2020   Essential hypertension 09/18/2018   GERD without esophagitis 09/18/2018   Family history of colon cancer 09/16/2017   Family history of malignant melanoma 09/16/2017   Family history of thyroid cancer 09/16/2017   Family history of breast cancer in mother     Kathrine Cords, OTR/L, [place]  08/13/2021, 4:51  PM  [address]. [place], [place], [address] Phone: [phone]   Fax:  [phone]  Name: Kathy Shaw MRN: [id number] Date of Birth: September 03, 1956

## 2021-09-01 ENCOUNTER — Ambulatory Visit (INDEPENDENT_AMBULATORY_CARE_PROVIDER_SITE_OTHER): Payer: PPO | Admitting: Family Medicine

## 2021-09-01 ENCOUNTER — Other Ambulatory Visit (HOSPITAL_BASED_OUTPATIENT_CLINIC_OR_DEPARTMENT_OTHER): Payer: Self-pay

## 2021-09-01 ENCOUNTER — Other Ambulatory Visit: Payer: Self-pay

## 2021-09-01 ENCOUNTER — Encounter: Payer: Self-pay | Admitting: Family Medicine

## 2021-09-01 VITALS — BP 128/84 | HR 85 | Temp 98.4°F | Ht 64.0 in | Wt 167.6 lb

## 2021-09-01 DIAGNOSIS — E538 Deficiency of other specified B group vitamins: Secondary | ICD-10-CM

## 2021-09-01 DIAGNOSIS — I10 Essential (primary) hypertension: Secondary | ICD-10-CM | POA: Diagnosis not present

## 2021-09-01 DIAGNOSIS — Z20828 Contact with and (suspected) exposure to other viral communicable diseases: Secondary | ICD-10-CM

## 2021-09-01 DIAGNOSIS — Z23 Encounter for immunization: Secondary | ICD-10-CM

## 2021-09-01 DIAGNOSIS — Z79899 Other long term (current) drug therapy: Secondary | ICD-10-CM | POA: Diagnosis not present

## 2021-09-01 LAB — BASIC METABOLIC PANEL
BUN: 11 mg/dL (ref 6–23)
CO2: 29 mEq/L (ref 19–32)
Calcium: 9.7 mg/dL (ref 8.4–10.5)
Chloride: 97 mEq/L (ref 96–112)
Creatinine, Ser: 0.93 mg/dL (ref 0.40–1.20)
GFR: 64.64 mL/min (ref >60.00)
Glucose, Bld: 86 mg/dL (ref 70–99)
Potassium: 4 mEq/L (ref 3.5–5.1)
Sodium: 135 mEq/L (ref 135–145)

## 2021-09-01 LAB — VITAMIN B12: Vitamin B-12: 498 pg/mL (ref 211–911)

## 2021-09-01 MED ORDER — LISINOPRIL-HYDROCHLOROTHIAZIDE 20-12.5 MG PO TABS
1.0000 | ORAL_TABLET | Freq: Every day | ORAL | 3 refills | Status: DC
  Filled 2021-09-01: qty 90, 90d supply, fill #0
  Filled 2021-12-01: qty 90, 90d supply, fill #1
  Filled 2022-04-12: qty 90, 90d supply, fill #2
  Filled 2022-07-12: qty 90, 90d supply, fill #3

## 2021-09-01 MED ORDER — OSELTAMIVIR PHOSPHATE 75 MG PO CAPS
75.0000 mg | ORAL_CAPSULE | Freq: Two times a day (BID) | ORAL | 0 refills | Status: DC
Start: 2021-09-01 — End: 2022-03-04

## 2021-09-01 NOTE — Addendum Note (Signed)
Addended by: Gean Birchwood on: 09/01/2021 09:13 AM   Modules accepted: Orders

## 2021-09-01 NOTE — Progress Notes (Signed)
Subjective  CC:  Chief Complaint  Patient presents with   Hypertension    HPI: Kathy Shaw is a 65 y.o. female who presents to the office today to address the problems listed above in the chief complaint. Hypertension f/u: Control is fair . Pt reports she is doing well. taking medications as instructed, no medication side effects noted, no TIAs, no chest pain on exertion, no dyspnea on exertion, no swelling of ankles. Has checked home readings a few times and it was good: 120s/ 70s. No h/o white coat syndrome. She denies adverse effects from his BP medications. Compliance with medication is good. Lisin/hctz 10/12.5 HM: to get covid shot next week. Eligible for prevnar 20 and flu today Exposed to flu over the last 7-10 days by grandchildren. Feels fine.   Assessment  1. Essential hypertension   2. Vitamin B12 deficiency   3. Long-term current use of proton pump inhibitor therapy   4. Exposure to the flu      Plan   Hypertension f/u: BP control is fairly well controlled. Will increase zestoretic to 20/12.5. Check renal function and electrolytes. B12 def due to PPI f/u: on oral b12; recheck levels today. Asymptomatic Flu exposure - 2 grandkids. Monitor for sxs. Tamiflu RX printed in case develops sxs. Education given.  Flu and prevnar vaccines today.   Education regarding management of these chronic disease states was given. Management strategies discussed on successive visits include dietary and exercise recommendations, goals of achieving and maintaining IBW, and lifestyle modifications aiming for adequate sleep and minimizing stressors.   Follow up: 6 mo for cpe and htn recheck  Orders Placed This Encounter  Procedures   Basic metabolic panel   Vitamin V78   Meds ordered this encounter  Medications   oseltamivir (TAMIFLU) 75 MG capsule    Sig: Take 1 capsule (75 mg total) by mouth 2 (two) times daily.    Dispense:  10 capsule    Refill:  0    lisinopril-hydrochlorothiazide (ZESTORETIC) 20-12.5 MG tablet    Sig: Take 1 tablet by mouth daily.    Dispense:  90 tablet    Refill:  3      BP Readings from Last 3 Encounters:  09/01/21 128/84  02/25/21 (!) 148/90  12/29/20 136/88   Wt Readings from Last 3 Encounters:  09/01/21 167 lb 9.6 oz (76 kg)  02/25/21 163 lb 3.2 oz (74 kg)  12/29/20 162 lb (73.5 kg)    Lab Results  Component Value Date   CHOL 219 (H) 02/25/2021   CHOL 228 (H) 01/14/2020   CHOL 218 (H) 09/18/2018   Lab Results  Component Value Date   HDL 102.90 02/25/2021   HDL 102.00 01/14/2020   HDL 104.80 09/18/2018   Lab Results  Component Value Date   LDLCALC 96 02/25/2021   LDLCALC 97 01/14/2020   LDLCALC 98 09/18/2018   Lab Results  Component Value Date   TRIG 97.0 02/25/2021   TRIG 146.0 01/14/2020   TRIG 73.0 09/18/2018   Lab Results  Component Value Date   CHOLHDL 2 02/25/2021   CHOLHDL 2 01/14/2020   CHOLHDL 2 09/18/2018   No results found for: LDLDIRECT Lab Results  Component Value Date   CREATININE 0.78 02/25/2021   BUN 9 02/25/2021   NA 139 02/25/2021   K 3.9 02/25/2021   CL 101 02/25/2021   CO2 28 02/25/2021    The ASCVD Risk score (Arnett DK, et al., 2019) failed to calculate for  the following reasons:   The valid HDL cholesterol range is 20 to 100 mg/dL  I reviewed the patients updated PMH, FH, and SocHx.    Patient Active Problem List   Diagnosis Date Noted   Vitamin B12 deficiency 09/01/2021   Long-term current use of proton pump inhibitor therapy 09/01/2021   Tubular adenoma 01/14/2020   Essential hypertension 09/18/2018   GERD without esophagitis 09/18/2018   Family history of colon cancer 09/16/2017   Family history of malignant melanoma 09/16/2017   Family history of thyroid cancer 09/16/2017   Family history of breast cancer in mother     Allergies: Patient has no known allergies.  Social History: Patient  reports that she has quit smoking. Her smoking  use included cigarettes. She has a 1.50 pack-year smoking history. She has never used smokeless tobacco. She reports current alcohol use of about 14.0 standard drinks per week. She reports that she does not use drugs.  Current Meds  Medication Sig   fluticasone (FLONASE) 50 MCG/ACT nasal spray Place 1 spray into both nostrils as needed for allergies or rhinitis.   lisinopril-hydrochlorothiazide (ZESTORETIC) 20-12.5 MG tablet Take 1 tablet by mouth daily.   omeprazole (PRILOSEC) 20 MG capsule Take 20 mg by mouth daily.   oseltamivir (TAMIFLU) 75 MG capsule Take 1 capsule (75 mg total) by mouth 2 (two) times daily.   vitamin B-12 (CYANOCOBALAMIN) 1000 MCG tablet Take 1,000 mcg by mouth daily.   [DISCONTINUED] lisinopril-hydrochlorothiazide (ZESTORETIC) 10-12.5 MG tablet Take 1 tablet by mouth daily.    Review of Systems: Cardiovascular: negative for chest pain, palpitations, leg swelling, orthopnea Respiratory: negative for SOB, wheezing or persistent cough Gastrointestinal: negative for abdominal pain Genitourinary: negative for dysuria or gross hematuria  Objective  Vitals: BP 128/84   Pulse 85   Temp 98.4 F (36.9 C) (Temporal)   Ht 5\' 4"  (1.626 m)   Wt 167 lb 9.6 oz (76 kg)   SpO2 97%   BMI 28.77 kg/m  General: no acute distress  Psych:  Alert and oriented, normal mood and affect HEENT:  Normocephalic, atraumatic, supple neck  Cardiovascular:  RRR without murmur. no edema Respiratory:  Good breath sounds bilaterally, CTAB with normal respiratory effort  Commons side effects, risks, benefits, and alternatives for medications and treatment plan prescribed today were discussed, and the patient expressed understanding of the given instructions. Patient is instructed to call or message via MyChart if he/she has any questions or concerns regarding our treatment plan. No barriers to understanding were identified. We discussed Red Flag symptoms and signs in detail. Patient expressed  understanding regarding what to do in case of urgent or emergency type symptoms.  Medication list was reconciled, printed and provided to the patient in AVS. Patient instructions and summary information was reviewed with the patient as documented in the AVS. This note was prepared with assistance of Dragon voice recognition software. Occasional wrong-word or sound-a-like substitutions may have occurred due to the inherent limitations of voice recognition software  This visit occurred during the SARS-CoV-2 public health emergency.  Safety protocols were in place, including screening questions prior to the visit, additional usage of staff PPE, and extensive cleaning of exam room while observing appropriate contact time as indicated for disinfecting solutions.

## 2021-10-08 ENCOUNTER — Ambulatory Visit: Payer: PPO | Admitting: Orthopaedic Surgery

## 2021-10-08 ENCOUNTER — Other Ambulatory Visit: Payer: Self-pay

## 2021-10-08 ENCOUNTER — Encounter: Payer: Self-pay | Admitting: Orthopaedic Surgery

## 2021-10-08 DIAGNOSIS — S63652A Sprain of metacarpophalangeal joint of right middle finger, initial encounter: Secondary | ICD-10-CM

## 2021-10-08 DIAGNOSIS — S63659A Sprain of metacarpophalangeal joint of unspecified finger, initial encounter: Secondary | ICD-10-CM

## 2021-10-08 NOTE — Progress Notes (Signed)
Office Visit Note   Patient: Kathy Shaw           Date of Birth: 1956/03/24           MRN: [id number] Visit Date: 10/08/2021              Requested by: Leamon Arnt, [address] [place],  [place] [address] PCP: Leamon Arnt, MD   Assessment & Plan: Visit Diagnoses:  1. Sagittal band rupture at metacarpophalangeal joint, initial encounter     Plan: Kathy Shaw returns today for follow-up of right long finger radial sagittal band rupture.  She has been wearing the extension splint for the last 8 weeks.  She has no complaints overall she is doing much better.  She is eager to discontinue the splint.  Examination of the right long finger shows stable extensor tendon without ulnar subluxation with arc of motion.  She has full arc of motion and she is able to make a full composite fist.  She has full extension.  Slight swelling and minimal tenderness to palpation over the radial sagittal band.  At this point we can discontinue the extension splint.  Based on my evaluation she does not require any formal hand therapy as she has full range of motion of the long finger.  She has excellent grip strength.  She can use her hand as tolerated.  Follow-up as needed.  Follow-Up Instructions: No follow-ups on file.   Orders:  No orders of the defined types were placed in this encounter.  No orders of the defined types were placed in this encounter.     Procedures: No procedures performed   Clinical Data: No additional findings.   Subjective: Chief Complaint  Patient presents with   Right Middle Finger - Follow-up    HPI  Review of Systems   Objective: Vital Signs: There were no vitals taken for this visit.  Physical Exam  Ortho Exam  Specialty Comments:  No specialty comments available.  Imaging: No results found.   PMFS History: Patient Active Problem List   Diagnosis Date Noted   Vitamin B12 deficiency 09/01/2021   Long-term current use of proton  pump inhibitor therapy 09/01/2021   Tubular adenoma 01/14/2020   Essential hypertension 09/18/2018   GERD without esophagitis 09/18/2018   Family history of colon cancer 09/16/2017   Family history of malignant melanoma 09/16/2017   Family history of thyroid cancer 09/16/2017   Family history of breast cancer in mother    Past Medical History:  Diagnosis Date   Allergy    Family history of breast cancer in mother    Family history of colon cancer 09/16/2017   Maternal aunt   GERD (gastroesophageal reflux disease)    Heart murmur    History of chicken pox    History of tear of ACL (anterior cruciate ligament)    left   Hypertension     Family History  Problem Relation Age of Onset   Breast cancer Mother    Thyroid cancer Mother    Melanoma Mother    Cancer Mother    Myasthenia gravis Father    Hypertension Father    Diabetes Father    Heart disease Father    Healthy Sister    Healthy Daughter    Healthy Son    Colon cancer Maternal Aunt    Breast cancer Maternal Grandmother    Healthy Sister    Healthy Sister    Healthy Daughter  Esophageal cancer Neg Hx    Rectal cancer Neg Hx    Stomach cancer Neg Hx     Past Surgical History:  Procedure Laterality Date   BREAST EXCISIONAL BIOPSY Left 2008   Dr. Margot Chimes   BREAST SURGERY Left    Atypical cells removed   COLONOSCOPY  08/2009-DJ   KNEE ARTHROSCOPY WITH ANTERIOR CRUCIATE LIGAMENT (ACL) REPAIR Left 2005   POLYPECTOMY     TONSILLECTOMY  1961   WISDOM TOOTH EXTRACTION Bilateral    Social History   Occupational History   Occupation: Therapist, sports, NICU    Employer: [employer]  Tobacco Use   Smoking status: Former    Packs/day: 0.50    Years: 3.00    Pack years: 1.50    Types: Cigarettes   Smokeless tobacco: Never   Tobacco comments:    quit 1981  Vaping Use   Vaping Use: Never used  Substance and Sexual Activity   Alcohol use: Yes    Alcohol/week: 14.0 standard drinks    Types: 14 Glasses of wine per  week   Drug use: No   Sexual activity: Yes    Birth control/protection: None, Post-menopausal

## 2021-12-01 ENCOUNTER — Other Ambulatory Visit (HOSPITAL_BASED_OUTPATIENT_CLINIC_OR_DEPARTMENT_OTHER): Payer: Self-pay

## 2021-12-31 ENCOUNTER — Other Ambulatory Visit: Payer: Self-pay | Admitting: Family Medicine

## 2021-12-31 DIAGNOSIS — Z1231 Encounter for screening mammogram for malignant neoplasm of breast: Secondary | ICD-10-CM

## 2022-02-04 ENCOUNTER — Ambulatory Visit
Admission: RE | Admit: 2022-02-04 | Discharge: 2022-02-04 | Disposition: A | Discharge status: Home / Self Care | Payer: PPO | Source: Ambulatory Visit

## 2022-02-04 DIAGNOSIS — Z1231 Encounter for screening mammogram for malignant neoplasm of breast: Secondary | ICD-10-CM

## 2022-02-05 ENCOUNTER — Other Ambulatory Visit: Payer: Self-pay | Admitting: Family Medicine

## 2022-02-05 DIAGNOSIS — R928 Other abnormal and inconclusive findings on diagnostic imaging of breast: Secondary | ICD-10-CM

## 2022-02-25 ENCOUNTER — Ambulatory Visit
Admission: RE | Admit: 2022-02-25 | Discharge: 2022-02-25 | Disposition: A | Discharge status: Home / Self Care | Payer: PPO | Source: Ambulatory Visit | Attending: Family Medicine | Admitting: Family Medicine

## 2022-02-25 ENCOUNTER — Ambulatory Visit: Payer: PPO

## 2022-02-25 ENCOUNTER — Ambulatory Visit: Admission: RE | Admit: 2022-02-25 | Payer: PPO | Source: Ambulatory Visit

## 2022-02-25 DIAGNOSIS — R928 Other abnormal and inconclusive findings on diagnostic imaging of breast: Secondary | ICD-10-CM

## 2022-03-04 ENCOUNTER — Ambulatory Visit (INDEPENDENT_AMBULATORY_CARE_PROVIDER_SITE_OTHER): Payer: PPO | Admitting: Family Medicine

## 2022-03-04 ENCOUNTER — Encounter: Payer: Self-pay | Admitting: Family Medicine

## 2022-03-04 ENCOUNTER — Other Ambulatory Visit (HOSPITAL_BASED_OUTPATIENT_CLINIC_OR_DEPARTMENT_OTHER): Payer: Self-pay

## 2022-03-04 VITALS — BP 124/82 | HR 82 | Temp 97.7°F | Ht 64.0 in | Wt 168.0 lb

## 2022-03-04 DIAGNOSIS — E538 Deficiency of other specified B group vitamins: Secondary | ICD-10-CM

## 2022-03-04 DIAGNOSIS — Z780 Asymptomatic menopausal state: Secondary | ICD-10-CM | POA: Diagnosis not present

## 2022-03-04 DIAGNOSIS — K219 Gastro-esophageal reflux disease without esophagitis: Secondary | ICD-10-CM

## 2022-03-04 DIAGNOSIS — I10 Essential (primary) hypertension: Secondary | ICD-10-CM | POA: Diagnosis not present

## 2022-03-04 DIAGNOSIS — Z0000 Encounter for general adult medical examination without abnormal findings: Secondary | ICD-10-CM | POA: Diagnosis not present

## 2022-03-04 MED ORDER — OMEPRAZOLE 20 MG PO CPDR
20.0000 mg | DELAYED_RELEASE_CAPSULE | Freq: Every day | ORAL | 3 refills | Status: DC
  Filled 2022-03-04: qty 90, 90d supply, fill #0
  Filled 2022-07-12: qty 90, 90d supply, fill #1
  Filled 2022-10-09: qty 90, 90d supply, fill #2

## 2022-03-04 NOTE — Patient Instructions (Signed)
Please return in 12 months for your annual complete physical; please come fasting.   I will release your lab results to you on your MyChart account with further instructions. You may see the results before I do, but when I review them I will send you a message with my report or have my assistant call you if things need to be discussed. Please reply to my message with any questions. Thank you!   If you have any questions or concerns, please don't hesitate to send me a message via MyChart or call the office at [phone]. Thank you for visiting with us today! It's our pleasure caring for you.   I have ordered a mammogram and/or bone density for you as we discussed today: []  Mammogram  [x]  Bone Density  Please call the office checked below to schedule your appointment:  [x]  [facility]      [address]       [place], [place]        [phone]         []  [facility]  [address]   [place], [place]  [phone]  

## 2022-03-04 NOTE — Progress Notes (Signed)
Subjective  Chief Complaint  Patient presents with   Annual Exam    Pt here for Annual exam and is not currently fasting.     HPI: Kathy Shaw is a 66 y.o. female who presents to [facility] today for a Female Wellness Visit. She also has the concerns and/or needs as listed above in the chief complaint. These will be addressed in addition to the Health Maintenance Visit.   Wellness Visit: annual visit with health maintenance review and exam without Pap  Health maintenance: Patient eligible for DEXA screening.  Due Pap smear but wishes to defer until next year.  Low risk.  Immunizations are current.  Colonoscopy screens are current.  She is retired.  Happy and doing well overall.  No concerns. Chronic disease f/u and/or acute problem visit: (deemed necessary to be done in addition to the wellness visit): Hypertension: Increased medication dose at last visit.  Blood pressure readings have now normalized.  No adverse effects.  No chest pain, shortness of breath, lower extremity edema, palpitations. GERD on chronic PPI: Well-controlled.  Feels she continues to need it daily.  Needs refill.  No melena Vitamin B12 deficiency on oral replacement.  Assessment  1. Annual physical exam   2. Essential hypertension   3. GERD without esophagitis   4. Vitamin B12 deficiency   5. Asymptomatic menopausal state      Plan  Female Wellness Visit: Age appropriate Health Maintenance and Prevention measures were discussed with patient. Included topics are cancer screening recommendations, ways to keep healthy (see AVS) including dietary and exercise recommendations, regular eye and dental care, use of seat belts, and avoidance of moderate alcohol use and tobacco use.  DEXA scan ordered for osteoporosis screening.  Will defer Pap smear until next year.  Mammogram is current.  CRC screening is current. BMI: discussed patient's BMI and encouraged positive lifestyle modifications  to help get to or maintain a target BMI. HM needs and immunizations were addressed and ordered. See below for orders. See HM and immunization section for updates. Routine labs and screening tests ordered including cmp, cbc and lipids where appropriate. Discussed recommendations regarding Vit D and calcium supplementation (see AVS)  Chronic disease management visit and/or acute problem visit: Essential hypertension is well controlled on Zestoretic 20/12.5 mg daily.  Check renal function electrolytes. GERD is well controlled on chronic omeprazole 20 mg daily. Recheck B12 levels on oral supplementation.  Has been controlled.  Follow up: 12 months for complete physical and follow-up hypertension Orders Placed This Encounter  Procedures   DG Bone Density   CBC with Differential/Platelet   Comp Met (CMET)   Lipid Profile   Vitamin B12   Meds ordered this encounter  Medications   omeprazole (PRILOSEC) 20 MG capsule    Sig: Take 1 capsule (20 mg total) by mouth daily.    Dispense:  90 capsule    Refill:  3      Body mass index is 28.84 kg/m. Wt Readings from Last 3 Encounters:  03/04/22 168 lb (76.2 kg)  09/01/21 167 lb 9.6 oz (76 kg)  02/25/21 163 lb 3.2 oz (74 kg)     Patient Active Problem List   Diagnosis Date Noted   Vitamin B12 deficiency 09/01/2021   Long-term current use of proton pump inhibitor therapy 09/01/2021   Tubular adenoma 01/14/2020    Dr. Ardis Hughs, colonoscopy 2010;  2022 tubular adenoma and serrated polyps; recall in 2025  Essential hypertension 09/18/2018   GERD without esophagitis 09/18/2018   Family history of colon cancer 09/16/2017    Maternal aunt     Family history of malignant melanoma 09/16/2017   Family history of thyroid cancer 09/16/2017    Mother     Family history of breast cancer in mother    Health Maintenance  Topic Date Due   COVID-19 Vaccine (4 - Booster for [place] series) 03/20/2022 (Originally 10/29/2020)   INFLUENZA  VACCINE  05/04/2022   PAP SMEAR-Modifier  09/16/2022   MAMMOGRAM  02/26/2023   COLONOSCOPY (Pts 45-9yr Insurance coverage will need to be confirmed)  12/30/2023   TETANUS/TDAP  06/28/2027   Pneumonia Vaccine 66 Years old  Completed   DEXA SCAN  Completed   Hepatitis C Screening  Completed   HIV Screening  Completed   Zoster Vaccines- Shingrix  Completed   HPV VACCINES  Aged Out   Immunization History  Administered Date(s) Administered   Fluad Quad(high Dose 65+) 09/01/2021   Influenza-Unspecified 07/01/2017, 06/27/2019, 08/04/2020   PFIZER(Purple Top)SARS-COV-2 Vaccination 09/30/2019, 10/22/2019, 09/03/2020   PNEUMOCOCCAL CONJUGATE-20 09/01/2021   Tdap 06/27/2017   Zoster Recombinat (Shingrix) 09/18/2018, 01/14/2020   We updated and reviewed the patient's past history in detail and it is documented below. Allergies: Patient has No Known Allergies. Past Medical History Patient  has a past medical history of Allergy, Family history of breast cancer in mother, Family history of colon cancer (09/16/2017), GERD (gastroesophageal reflux disease), Heart murmur, History of chicken pox, History of tear of ACL (anterior cruciate ligament), and Hypertension. Past Surgical History Patient  has a past surgical history that includes Knee arthroscopy with anterior cruciate ligament (acl) repair (Left, 2005); Breast surgery (Left); Tonsillectomy (1961); Wisdom tooth extraction (Bilateral); Breast excisional biopsy (Left, 2008); Colonoscopy (08/2009-DJ); and Polypectomy. Family History: Patient family history includes Breast cancer in her maternal grandmother and mother; Cancer in her mother; Colon cancer in her maternal aunt; Diabetes in her father; Healthy in her daughter, daughter, sister, sister, sister, and son; Heart disease in her father; Hypertension in her father; Melanoma in her mother; Myasthenia gravis in her father; Thyroid cancer in her mother. Social History:  Patient  reports that  she has quit smoking. Her smoking use included cigarettes. She has a 1.50 pack-year smoking history. She has never used smokeless tobacco. She reports current alcohol use of about 14.0 standard drinks per week. She reports that she does not use drugs.  Review of Systems: Constitutional: negative for fever or malaise Ophthalmic: negative for photophobia, double vision or loss of vision Cardiovascular: negative for chest pain, dyspnea on exertion, or new LE swelling Respiratory: negative for SOB or persistent cough Gastrointestinal: negative for abdominal pain, change in bowel habits or melena Genitourinary: negative for dysuria or gross hematuria, no abnormal uterine bleeding or disharge Musculoskeletal: negative for new gait disturbance or muscular weakness Integumentary: negative for new or persistent rashes, no breast lumps Neurological: negative for TIA or stroke symptoms Psychiatric: negative for SI or delusions Allergic/Immunologic: negative for hives  Patient Care Team    Relationship Specialty Notifications Start End  ALeamon Arnt MD PCP - General Family Medicine  09/16/17   JMilus Banister MD Consulting Physician Gastroenterology  01/14/20     Objective  Vitals: BP 124/82 Comment: rechecked  Pulse 82   Temp 97.7 F (36.5 C)   Ht '5\' 4"'  (1.626 m)   Wt 168 lb (76.2 kg)   SpO2 97%   BMI 28.84 kg/m  General:  Well developed, well nourished, no acute distress  Psych:  Alert and orientedx3,normal mood and affect HEENT:  Normocephalic, atraumatic, non-icteric sclera,  supple neck without adenopathy, mass or thyromegaly Cardiovascular:  Normal S1, S2, RRR without gallop, rub or murmur Respiratory:  Good breath sounds bilaterally, CTAB with normal respiratory effort Gastrointestinal: normal bowel sounds, soft, non-tender, no noted masses. No HSM MSK: no deformities, contusions. Joints are without erythema or swelling.  Skin:  Warm, no rashes or suspicious lesions  noted Neurologic:    Mental status is normal. CN 2-11 are normal. Gross motor and sensory exams are normal. Normal gait. No tremor   Commons side effects, risks, benefits, and alternatives for medications and treatment plan prescribed today were discussed, and the patient expressed understanding of the given instructions. Patient is instructed to call or message via MyChart if he/she has any questions or concerns regarding our treatment plan. No barriers to understanding were identified. We discussed Red Flag symptoms and signs in detail. Patient expressed understanding regarding what to do in case of urgent or emergency type symptoms.  Medication list was reconciled, printed and provided to the patient in AVS. Patient instructions and summary information was reviewed with the patient as documented in the AVS. This note was prepared with assistance of Dragon voice recognition software. Occasional wrong-word or sound-a-like substitutions may have occurred due to the inherent limitations of voice recognition software  This visit occurred during the SARS-CoV-2 public health emergency.  Safety protocols were in place, including screening questions prior to the visit, additional usage of staff PPE, and extensive cleaning of exam room while observing appropriate contact time as indicated for disinfecting solutions.

## 2022-03-10 ENCOUNTER — Other Ambulatory Visit: Payer: PPO

## 2022-03-10 LAB — LIPID PANEL
Cholesterol: 235 mg/dL — ABNORMAL HIGH (ref 0–200)
HDL: 114 mg/dL (ref >39.00)
LDL Cholesterol: 108 mg/dL — ABNORMAL HIGH (ref 0–99)
NonHDL: 121.16
Total CHOL/HDL Ratio: 2
Triglycerides: 64 mg/dL (ref 0.0–149.0)
VLDL: 12.8 mg/dL (ref 0.0–40.0)

## 2022-03-10 LAB — VITAMIN B12: Vitamin B-12: 805 pg/mL (ref 211–911)

## 2022-03-10 LAB — CBC WITH DIFFERENTIAL/PLATELET
Basophils Absolute: 0 10*3/uL (ref 0.0–0.1)
Basophils Relative: 0.8 % (ref 0.0–3.0)
Eosinophils Absolute: 0.1 10*3/uL (ref 0.0–0.7)
Eosinophils Relative: 1.3 % (ref 0.0–5.0)
HCT: 38.6 % (ref 36.0–46.0)
Hemoglobin: 13 g/dL (ref 12.0–15.0)
Lymphocytes Relative: 33.7 % (ref 12.0–46.0)
Lymphs Abs: 1.6 10*3/uL (ref 0.7–4.0)
MCHC: 33.8 g/dL (ref 30.0–36.0)
MCV: 100.9 fl — ABNORMAL HIGH (ref 78.0–100.0)
Monocytes Absolute: 0.6 10*3/uL (ref 0.1–1.0)
Monocytes Relative: 12.8 % — ABNORMAL HIGH (ref 3.0–12.0)
Neutro Abs: 2.5 10*3/uL (ref 1.4–7.7)
Neutrophils Relative %: 51.4 % (ref 43.0–77.0)
Platelets: 279 10*3/uL (ref 150.0–400.0)
RBC: 3.82 Mil/uL — ABNORMAL LOW (ref 3.87–5.11)
RDW: 13.9 % (ref 11.5–15.5)
WBC: 4.8 10*3/uL (ref 4.0–10.5)

## 2022-03-10 LAB — COMPREHENSIVE METABOLIC PANEL
ALT: 38 U/L — ABNORMAL HIGH (ref 0–35)
AST: 29 U/L (ref 0–37)
Albumin: 4.2 g/dL (ref 3.5–5.2)
Alkaline Phosphatase: 47 U/L (ref 39–117)
BUN: 10 mg/dL (ref 6–23)
CO2: 27 mEq/L (ref 19–32)
Calcium: 10 mg/dL (ref 8.4–10.5)
Chloride: 98 mEq/L (ref 96–112)
Creatinine, Ser: 0.84 mg/dL (ref 0.40–1.20)
GFR: 72.78 mL/min (ref >60.00)
Glucose, Bld: 90 mg/dL (ref 70–99)
Potassium: 4.4 mEq/L (ref 3.5–5.1)
Sodium: 134 mEq/L — ABNORMAL LOW (ref 135–145)
Total Bilirubin: 0.6 mg/dL (ref 0.2–1.2)
Total Protein: 6.4 g/dL (ref 6.0–8.3)

## 2022-03-24 ENCOUNTER — Other Ambulatory Visit: Payer: Self-pay | Admitting: Family Medicine

## 2022-03-24 DIAGNOSIS — Z780 Asymptomatic menopausal state: Secondary | ICD-10-CM

## 2022-04-12 ENCOUNTER — Other Ambulatory Visit (HOSPITAL_BASED_OUTPATIENT_CLINIC_OR_DEPARTMENT_OTHER): Payer: Self-pay

## 2022-06-28 ENCOUNTER — Encounter: Payer: Self-pay | Admitting: *Deleted

## 2022-06-29 ENCOUNTER — Ambulatory Visit (INDEPENDENT_AMBULATORY_CARE_PROVIDER_SITE_OTHER): Payer: PPO

## 2022-06-29 DIAGNOSIS — Z0000 Encounter for general adult medical examination without abnormal findings: Secondary | ICD-10-CM

## 2022-06-29 NOTE — Patient Instructions (Signed)
Kathy Shaw , Thank you for taking time to come for your Medicare Wellness Visit. I appreciate your ongoing commitment to your health goals. Please review the following plan we discussed and let me know if I can assist you in the future.   These are the goals we discussed:  Goals      Patient Stated     Maintain health         This is a list of the screening recommended for you and due dates:  Health Maintenance  Topic Date Due   COVID-19 Vaccine (4 - Pfizer risk series) 10/29/2020   Flu Shot  05/04/2022   Mammogram  02/26/2023   Colon Cancer Screening  12/30/2023   Tetanus Vaccine  06/28/2027   Pneumonia Vaccine  Completed   DEXA scan (bone density measurement)  Completed   Hepatitis C Screening: USPSTF Recommendation to screen - Ages 61-79 yo.  Completed   Zoster (Shingles) Vaccine  Completed   HPV Vaccine  Aged Out    Advanced directives: .Please bring a copy of your health care power of attorney and living will to the office at your convenience.  Conditions/risks identified: maintain health  Next appointment: Follow up in one year for your annual wellness visit    Preventive Care 65 Years and Older, Female Preventive care refers to lifestyle choices and visits with your health care provider that can promote health and wellness. What does preventive care include? A yearly physical exam. This is also called an annual well check. Dental exams once or twice a year. Routine eye exams. Ask your health care provider how often you should have your eyes checked. Personal lifestyle choices, including: Daily care of your teeth and gums. Regular physical activity. Eating a healthy diet. Avoiding tobacco and drug use. Limiting alcohol use. Practicing safe sex. Taking low-dose aspirin every day. Taking vitamin and mineral supplements as recommended by your health care provider. What happens during an annual well check? The services and screenings done by your health care  provider during your annual well check will depend on your age, overall health, lifestyle risk factors, and family history of disease. Counseling  Your health care provider may ask you questions about your: Alcohol use. Tobacco use. Drug use. Emotional well-being. Home and relationship well-being. Sexual activity. Eating habits. History of falls. Memory and ability to understand (cognition). Work and work Statistician. Reproductive health. Screening  You may have the following tests or measurements: Height, weight, and BMI. Blood pressure. Lipid and cholesterol levels. These may be checked every 5 years, or more frequently if you are over 60 years old. Skin check. Lung cancer screening. You may have this screening every year starting at age 25 if you have a 30-pack-year history of smoking and currently smoke or have quit within the past 15 years. Fecal occult blood test (FOBT) of the stool. You may have this test every year starting at age 55. Flexible sigmoidoscopy or colonoscopy. You may have a sigmoidoscopy every 5 years or a colonoscopy every 10 years starting at age 60. Hepatitis C blood test. Hepatitis B blood test. Sexually transmitted disease (STD) testing. Diabetes screening. This is done by checking your blood sugar (glucose) after you have not eaten for a while (fasting). You may have this done every 1-3 years. Bone density scan. This is done to screen for osteoporosis. You may have this done starting at age 56. Mammogram. This may be done every 1-2 years. Talk to your health care provider about how  often you should have regular mammograms. Talk with your health care provider about your test results, treatment options, and if necessary, the need for more tests. Vaccines  Your health care provider may recommend certain vaccines, such as: Influenza vaccine. This is recommended every year. Tetanus, diphtheria, and acellular pertussis (Tdap, Td) vaccine. You may need a Td  booster every 10 years. Zoster vaccine. You may need this after age 39. Pneumococcal 13-valent conjugate (PCV13) vaccine. One dose is recommended after age 61. Pneumococcal polysaccharide (PPSV23) vaccine. One dose is recommended after age 50. Talk to your health care provider about which screenings and vaccines you need and how often you need them. This information is not intended to replace advice given to you by your health care provider. Make sure you discuss any questions you have with your health care provider. Document Released: 10/17/2015 Document Revised: 06/09/2016 Document Reviewed: 07/22/2015 Elsevier Interactive Patient Education  2017 [place] Prevention in the Home Falls can cause injuries. They can happen to people of all ages. There are many things you can do to make your home safe and to help prevent falls. What can I do on the outside of my home? Regularly fix the edges of walkways and driveways and fix any cracks. Remove anything that might make you trip as you walk through a door, such as a raised step or threshold. Trim any bushes or trees on the path to your home. Use bright outdoor lighting. Clear any walking paths of anything that might make someone trip, such as rocks or tools. Regularly check to see if handrails are loose or broken. Make sure that both sides of any steps have handrails. Any raised decks and porches should have guardrails on the edges. Have any leaves, snow, or ice cleared regularly. Use sand or salt on walking paths during winter. Clean up any spills in your garage right away. This includes oil or grease spills. What can I do in the bathroom? Use night lights. Install grab bars by the toilet and in the tub and shower. Do not use towel bars as grab bars. Use non-skid mats or decals in the tub or shower. If you need to sit down in the shower, use a plastic, non-slip stool. Keep the floor dry. Clean up any water that spills on the floor  as soon as it happens. Remove soap buildup in the tub or shower regularly. Attach bath mats securely with double-sided non-slip rug tape. Do not have throw rugs and other things on the floor that can make you trip. What can I do in the bedroom? Use night lights. Make sure that you have a light by your bed that is easy to reach. Do not use any sheets or blankets that are too big for your bed. They should not hang down onto the floor. Have a firm chair that has side arms. You can use this for support while you get dressed. Do not have throw rugs and other things on the floor that can make you trip. What can I do in the kitchen? Clean up any spills right away. Avoid walking on wet floors. Keep items that you use a lot in easy-to-reach places. If you need to reach something above you, use a strong step stool that has a grab bar. Keep electrical cords out of the way. Do not use floor polish or wax that makes floors slippery. If you must use wax, use non-skid floor wax. Do not have throw rugs and other things  on the floor that can make you trip. What can I do with my stairs? Do not leave any items on the stairs. Make sure that there are handrails on both sides of the stairs and use them. Fix handrails that are broken or loose. Make sure that handrails are as long as the stairways. Check any carpeting to make sure that it is firmly attached to the stairs. Fix any carpet that is loose or worn. Avoid having throw rugs at the top or bottom of the stairs. If you do have throw rugs, attach them to the floor with carpet tape. Make sure that you have a light switch at the top of the stairs and the bottom of the stairs. If you do not have them, ask someone to add them for you. What else can I do to help prevent falls? Wear shoes that: Do not have high heels. Have rubber bottoms. Are comfortable and fit you well. Are closed at the toe. Do not wear sandals. If you use a stepladder: Make sure that it is  fully opened. Do not climb a closed stepladder. Make sure that both sides of the stepladder are locked into place. Ask someone to hold it for you, if possible. Clearly mark and make sure that you can see: Any grab bars or handrails. First and last steps. Where the edge of each step is. Use tools that help you move around (mobility aids) if they are needed. These include: Canes. Walkers. Scooters. Crutches. Turn on the lights when you go into a dark area. Replace any light bulbs as soon as they burn out. Set up your furniture so you have a clear path. Avoid moving your furniture around. If any of your floors are uneven, fix them. If there are any pets around you, be aware of where they are. Review your medicines with your doctor. Some medicines can make you feel dizzy. This can increase your chance of falling. Ask your doctor what other things that you can do to help prevent falls. This information is not intended to replace advice given to you by your health care provider. Make sure you discuss any questions you have with your health care provider. Document Released: 07/17/2009 Document Revised: 02/26/2016 Document Reviewed: 10/25/2014 Elsevier Interactive Patient Education  2017 [employer].

## 2022-06-29 NOTE — Progress Notes (Addendum)
Virtual Visit via Telephone Note  I connected with  Kathy Shaw on 06/29/22 at 10:45 AM EDT by telephone and verified that I am speaking with the correct person using two identifiers.  Medicare Annual Wellness visit completed telephonically due to Covid-19 pandemic.   Persons participating in this call: This Health Coach and this patient.   Location: Patient: home Provider: office   I discussed the limitations, risks, security and privacy concerns of performing an evaluation and management service by telephone and the availability of in person appointments. The patient expressed understanding and agreed to proceed.  Unable to perform video visit due to video visit attempted and failed and/or patient does not have video capability.   Some vital signs may be absent or patient reported.   Willette Brace, LPN   Subjective:   Kathy Shaw is a 66 y.o. female who presents for an Initial Medicare Annual Wellness Visit.  Review of Systems     Cardiac Risk Factors include: advanced age (>86mn, >>76women);hypertension     Objective:    There were no vitals filed for this visit. There is no height or weight on file to calculate BMI.     06/29/2022   10:49 AM  Advanced Directives  Does Patient Have a Medical Advance Directive? No  Would patient like information on creating a medical advance directive? Yes (MAU/Ambulatory/Procedural Areas - Information given)    Current Medications (verified) Outpatient Encounter Medications as of 06/29/2022  Medication Sig   fluticasone (FLONASE) 50 MCG/ACT nasal spray Place 1 spray into both nostrils as needed for allergies or rhinitis.   lisinopril-hydrochlorothiazide (ZESTORETIC) 20-12.5 MG tablet Take 1 tablet by mouth daily.   omeprazole (PRILOSEC) 20 MG capsule Take 1 capsule (20 mg total) by mouth daily.   vitamin B-12 (CYANOCOBALAMIN) 1000 MCG tablet Take 1,000 mcg by mouth daily.   No facility-administered encounter medications on  file as of 06/29/2022.    Allergies (verified) Patient has no known allergies.   History: Past Medical History:  Diagnosis Date   Allergy    Family history of breast cancer in mother    Family history of colon cancer 09/16/2017   Maternal aunt   GERD (gastroesophageal reflux disease)    Heart murmur    History of chicken pox    History of tear of ACL (anterior cruciate ligament)    left   Hypertension    Past Surgical History:  Procedure Laterality Date   BREAST EXCISIONAL BIOPSY Left 2008   Dr. SMargot Chimes  BREAST SURGERY Left    Atypical cells removed   COLONOSCOPY  08/2009-DJ   KNEE ARTHROSCOPY WITH ANTERIOR CRUCIATE LIGAMENT (ACL) REPAIR Left 2005   POLYPECTOMY     TONSILLECTOMY  1961   WISDOM TOOTH EXTRACTION Bilateral    Family History  Problem Relation Age of Onset   Breast cancer Mother    Thyroid cancer Mother    Melanoma Mother    Cancer Mother    Myasthenia gravis Father    Hypertension Father    Diabetes Father    Heart disease Father    Healthy Sister    Healthy Daughter    Healthy Son    Colon cancer Maternal Aunt    Breast cancer Maternal Grandmother    Healthy Sister    Healthy Sister    Healthy Daughter    Esophageal cancer Neg Hx    Rectal cancer Neg Hx    Stomach cancer Neg Hx    Social  History   Socioeconomic History   Marital status: Married    Spouse name: Artis Delay   Number of children: 3   Years of education: Not on file   Highest education level: Not on file  Occupational History   Occupation: Therapist, sports, NICU    Employer: [employer]  Tobacco Use   Smoking status: Former    Packs/day: 0.50    Years: 3.00    Total pack years: 1.50    Types: Cigarettes   Smokeless tobacco: Never   Tobacco comments:    quit 1981  Vaping Use   Vaping Use: Never used  Substance and Sexual Activity   Alcohol use: Yes    Alcohol/week: 14.0 standard drinks of alcohol    Types: 14 Glasses of wine per week   Drug use: No   Sexual activity: Yes     Birth control/protection: None, Post-menopausal  Other Topics Concern   Not on file  Social History Narrative   Retired Museum/gallery exhibitions officer, 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   Social Determinants of Health   Financial Resource Strain: Low Risk  (06/29/2022)   Overall Financial Resource Strain (CARDIA)    Difficulty of Paying Living Expenses: Not hard at all  Food Insecurity: No Food Insecurity (06/29/2022)   Hunger Vital Sign    Worried About Running Out of Food in the Last Year: Never true    Ran Out of Food in the Last Year: Never true  Transportation Needs: No Transportation Needs (06/29/2022)   PRAPARE - Hydrologist (Medical): No    Lack of Transportation (Non-Medical): No  Physical Activity: Sufficiently Active (06/29/2022)   Exercise Vital Sign    Days of Exercise per Week: 5 days    Minutes of Exercise per Session: 30 min  Stress: No Stress Concern Present (06/29/2022)   [place]    Feeling of Stress : Not at all  Social Connections: Moderately Integrated (06/29/2022)   Social Connection and Isolation Panel [NHANES]    Frequency of Communication with Friends and Family: Three times a week    Frequency of Social Gatherings with Friends and Family: Three times a week    Attends Religious Services: Never    Active Member of Clubs or Organizations: Yes    Attends Archivist Meetings: 1 to 4 times per year    Marital Status: Married    Tobacco Counseling Counseling given: Not Answered Tobacco comments: quit 1981   Clinical Intake:  Pre-visit preparation completed: Yes  Pain : No/denies pain     Nutritional Risks: None Diabetes: No  How often do you need to have someone help you when you read instructions, pamphlets, or other written materials from your doctor or pharmacy?: 1 - Never  Diabetic?no  Interpreter Needed?: No  Information entered by :: Charlott Rakes, LPN   Activities of  Daily Living    06/29/2022   10:51 AM  In your present state of health, do you have any difficulty performing the following activities:  Hearing? 0  Vision? 0  Difficulty concentrating or making decisions? 0  Walking or climbing stairs? 0  Dressing or bathing? 0  Doing errands, shopping? 0  Preparing Food and eating ? N  Using the Toilet? N  In the past six months, have you accidently leaked urine? N  Do you have problems with loss of bowel control? N  Managing your Medications? N  Managing your Finances? N  Housekeeping or managing your  Housekeeping? N    Patient Care Team: Leamon Arnt, MD as PCP - General (Family Medicine) Milus Banister, MD as Consulting Physician (Gastroenterology)  Indicate any recent Medical Services you may have received from other than [facility] providers in the past year (date may be approximate).     Assessment:   This is a routine wellness examination for [place] [address].  Hearing/Vision screen Hearing Screening - Comments:: Pt denies any hearing issues  Vision Screening - Comments:: Pt follows up with Dr Oswaldo Conroy for annual eye exams   Dietary issues and exercise activities discussed: Current Exercise Habits: Home exercise routine, Type of exercise: walking, Time (Minutes): 30, Frequency (Times/Week): 5, Weekly Exercise (Minutes/Week): 150   Goals Addressed             This Visit's Progress    Patient Stated       Maintain health        Depression Screen    06/29/2022   10:47 AM 03/04/2022    9:10 AM 02/25/2021    9:56 AM 01/14/2020    1:33 PM 09/16/2017    9:03 AM  PHQ 2/9 Scores  PHQ - 2 Score 0 0 0 0 0  PHQ- 9 Score    0     Fall Risk    06/29/2022   10:51 AM 03/04/2022    9:10 AM 09/01/2021    8:23 AM  Fall Risk   Falls in the past year? 0 0 0  Number falls in past yr: 0 0 0  Injury with Fall? 0 0 0  Risk for fall due to : No Fall Risks;Impaired vision No Fall Risks   Follow up Falls prevention discussed Falls evaluation completed      FALL RISK PREVENTION PERTAINING TO THE HOME:  Any stairs in or around the home? Yes  If so, are there any without handrails? No  Home free of loose throw rugs in walkways, pet beds, electrical cords, etc? Yes  Adequate lighting in your home to reduce risk of falls? Yes   ASSISTIVE DEVICES UTILIZED TO PREVENT FALLS:  Life alert? No  Use of a cane, walker or w/c? No  Grab bars in the bathroom? No  Shower chair or bench in shower? Yes  Elevated toilet seat or a handicapped toilet? No   TIMED UP AND GO:  Was the test performed? No .  Cognitive Function:        06/29/2022   10:51 AM  6CIT Screen  What Year? 0 points  What month? 0 points  What time? 0 points  Count back from 20 0 points  Months in reverse 0 points  Repeat phrase 0 points  Total Score 0 points    Immunizations Immunization History  Administered Date(s) Administered   Fluad Quad(high Dose 65+) 09/01/2021   Influenza-Unspecified 07/01/2017, 06/27/2019, 08/04/2020   PFIZER(Purple Top)SARS-COV-2 Vaccination 09/30/2019, 10/22/2019, 09/03/2020   PNEUMOCOCCAL CONJUGATE-20 09/01/2021   Tdap 06/27/2017   Zoster Recombinat (Shingrix) 09/18/2018, 01/14/2020    TDAP status: Up to date  Flu Vaccine status: Due, Education has been provided regarding the importance of this vaccine. Advised may receive this vaccine at local pharmacy or Health Dept. Aware to provide a copy of the vaccination record if obtained from local pharmacy or Health Dept. Verbalized acceptance and understanding.  Pneumococcal vaccine status: Up to date  Covid-19 vaccine status: Completed vaccines  Qualifies for Shingles Vaccine? Yes   Zostavax completed Yes   Shingrix Completed?: Yes  Screening Tests Health Maintenance  Topic Date Due   COVID-19 Vaccine (4 - Pfizer risk series) 10/29/2020   INFLUENZA VACCINE  05/04/2022   MAMMOGRAM  02/26/2023   COLONOSCOPY (Pts 45-32yr Insurance coverage will need to be confirmed)  12/30/2023    TETANUS/TDAP  06/28/2027   Pneumonia Vaccine 66 Years old  Completed   DEXA SCAN  Completed   Hepatitis C Screening  Completed   Zoster Vaccines- Shingrix  Completed   HPV VACCINES  Aged Out    Health Maintenance  Health Maintenance Due  Topic Date Due   COVID-19 Vaccine (4 - Pfizer risk series) 10/29/2020   INFLUENZA VACCINE  05/04/2022    Colorectal cancer screening: Type of screening: Colonoscopy. Completed 12/29/20. Repeat every 3 years  Mammogram status: Completed 02/25/22. Repeat every year  Bone density 06/20/13   Additional Screening:  Hepatitis C Screening:  Completed 09/16/17  Vision Screening: Recommended annual ophthalmology exams for early detection of glaucoma and other disorders of the eye. Is the patient up to date with their annual eye exam?  Yes  Who is the provider or what is the name of the office in which the patient attends annual eye exams? Dr BOswaldo Conroy If pt is not established with a provider, would they like to be referred to a provider to establish care? No .   Dental Screening: Recommended annual dental exams for proper oral hygiene  Community Resource Referral / Chronic Care Management: CRR required this visit?  No   CCM required this visit?  No      Plan:     I have personally reviewed and noted the following in the patient's chart:   Medical and social history Use of alcohol, tobacco or illicit drugs  Current medications and supplements including opioid prescriptions. Patient is not currently taking opioid prescriptions. Functional ability and status Nutritional status Physical activity Advanced directives List of other physicians Hospitalizations, surgeries, and ER visits in previous 12 months Vitals Screenings to include cognitive, depression, and falls Referrals and appointments  In addition, I have reviewed and discussed with patient certain preventive protocols, quality metrics, and best practice recommendations. A written  personalized care plan for preventive services as well as general preventive health recommendations were provided to patient.     TWillette Brace LPN   [id number]  Nurse Notes: none

## 2022-07-12 ENCOUNTER — Other Ambulatory Visit (HOSPITAL_BASED_OUTPATIENT_CLINIC_OR_DEPARTMENT_OTHER): Payer: Self-pay

## 2022-07-19 ENCOUNTER — Other Ambulatory Visit (HOSPITAL_COMMUNITY): Payer: Self-pay

## 2022-08-31 ENCOUNTER — Ambulatory Visit
Admission: RE | Admit: 2022-08-31 | Discharge: 2022-08-31 | Disposition: A | Discharge status: Home / Self Care | Payer: PPO | Source: Ambulatory Visit | Attending: Family Medicine | Admitting: Family Medicine

## 2022-08-31 DIAGNOSIS — Z780 Asymptomatic menopausal state: Secondary | ICD-10-CM

## 2022-09-09 ENCOUNTER — Encounter: Payer: Self-pay | Admitting: Family Medicine

## 2022-09-09 DIAGNOSIS — M8580 Other specified disorders of bone density and structure, unspecified site: Secondary | ICD-10-CM | POA: Insufficient documentation

## 2022-09-16 ENCOUNTER — Encounter: Payer: Self-pay | Admitting: *Deleted

## 2022-10-09 ENCOUNTER — Other Ambulatory Visit (HOSPITAL_BASED_OUTPATIENT_CLINIC_OR_DEPARTMENT_OTHER): Payer: Self-pay

## 2022-10-09 ENCOUNTER — Other Ambulatory Visit: Payer: Self-pay | Admitting: Family Medicine

## 2022-10-11 ENCOUNTER — Other Ambulatory Visit: Payer: Self-pay

## 2022-10-11 ENCOUNTER — Other Ambulatory Visit (HOSPITAL_BASED_OUTPATIENT_CLINIC_OR_DEPARTMENT_OTHER): Payer: Self-pay

## 2022-10-11 MED ORDER — LISINOPRIL-HYDROCHLOROTHIAZIDE 20-12.5 MG PO TABS
1.0000 | ORAL_TABLET | Freq: Every day | ORAL | 3 refills | Status: DC
  Filled 2022-10-11: qty 90, 90d supply, fill #0
  Filled 2023-03-07: qty 90, 90d supply, fill #1
  Filled 2023-06-13: qty 90, 90d supply, fill #2

## 2022-10-12 ENCOUNTER — Ambulatory Visit: Payer: PPO

## 2022-10-14 ENCOUNTER — Ambulatory Visit (INDEPENDENT_AMBULATORY_CARE_PROVIDER_SITE_OTHER): Payer: Medicare HMO

## 2022-10-14 DIAGNOSIS — Z23 Encounter for immunization: Secondary | ICD-10-CM

## 2022-11-29 DIAGNOSIS — L814 Other melanin hyperpigmentation: Secondary | ICD-10-CM | POA: Diagnosis not present

## 2022-11-29 DIAGNOSIS — L853 Xerosis cutis: Secondary | ICD-10-CM | POA: Diagnosis not present

## 2022-11-29 DIAGNOSIS — D225 Melanocytic nevi of trunk: Secondary | ICD-10-CM | POA: Diagnosis not present

## 2022-11-29 DIAGNOSIS — D1801 Hemangioma of skin and subcutaneous tissue: Secondary | ICD-10-CM | POA: Diagnosis not present

## 2023-01-31 ENCOUNTER — Other Ambulatory Visit: Payer: Self-pay | Admitting: Family Medicine

## 2023-01-31 DIAGNOSIS — Z1231 Encounter for screening mammogram for malignant neoplasm of breast: Secondary | ICD-10-CM

## 2023-03-07 ENCOUNTER — Ambulatory Visit
Admission: RE | Admit: 2023-03-07 | Discharge: 2023-03-07 | Disposition: A | Discharge status: Home / Self Care | Payer: Medicare HMO | Source: Ambulatory Visit | Attending: Family Medicine | Admitting: Family Medicine

## 2023-03-07 ENCOUNTER — Other Ambulatory Visit: Payer: Self-pay | Admitting: Family Medicine

## 2023-03-07 DIAGNOSIS — Z1231 Encounter for screening mammogram for malignant neoplasm of breast: Secondary | ICD-10-CM

## 2023-03-08 ENCOUNTER — Other Ambulatory Visit (HOSPITAL_BASED_OUTPATIENT_CLINIC_OR_DEPARTMENT_OTHER): Payer: Self-pay

## 2023-03-08 ENCOUNTER — Other Ambulatory Visit: Payer: Self-pay

## 2023-03-08 ENCOUNTER — Encounter: Payer: PPO | Admitting: Family Medicine

## 2023-03-08 MED ORDER — OMEPRAZOLE 20 MG PO CPDR
20.0000 mg | DELAYED_RELEASE_CAPSULE | Freq: Every day | ORAL | 0 refills | Status: DC
  Filled 2023-03-08: qty 90, 90d supply, fill #0

## 2023-03-09 ENCOUNTER — Other Ambulatory Visit: Payer: Self-pay | Admitting: Family Medicine

## 2023-03-09 DIAGNOSIS — R928 Other abnormal and inconclusive findings on diagnostic imaging of breast: Secondary | ICD-10-CM

## 2023-03-11 ENCOUNTER — Other Ambulatory Visit (HOSPITAL_BASED_OUTPATIENT_CLINIC_OR_DEPARTMENT_OTHER): Payer: Self-pay

## 2023-03-28 ENCOUNTER — Encounter: Payer: Self-pay | Admitting: Family Medicine

## 2023-03-28 ENCOUNTER — Ambulatory Visit
Admission: RE | Admit: 2023-03-28 | Discharge: 2023-03-28 | Disposition: A | Discharge status: Home / Self Care | Payer: Medicare HMO | Source: Ambulatory Visit | Attending: Family Medicine | Admitting: Family Medicine

## 2023-03-28 DIAGNOSIS — N6310 Unspecified lump in the right breast, unspecified quadrant: Secondary | ICD-10-CM | POA: Diagnosis not present

## 2023-03-28 DIAGNOSIS — R928 Other abnormal and inconclusive findings on diagnostic imaging of breast: Secondary | ICD-10-CM

## 2023-03-28 DIAGNOSIS — R921 Mammographic calcification found on diagnostic imaging of breast: Secondary | ICD-10-CM | POA: Diagnosis not present

## 2023-03-28 DIAGNOSIS — N6001 Solitary cyst of right breast: Secondary | ICD-10-CM | POA: Diagnosis not present

## 2023-05-19 ENCOUNTER — Encounter (INDEPENDENT_AMBULATORY_CARE_PROVIDER_SITE_OTHER): Payer: Self-pay

## 2023-06-08 ENCOUNTER — Other Ambulatory Visit (HOSPITAL_BASED_OUTPATIENT_CLINIC_OR_DEPARTMENT_OTHER): Payer: Self-pay

## 2023-06-08 ENCOUNTER — Encounter: Payer: Self-pay | Admitting: Family Medicine

## 2023-06-08 ENCOUNTER — Ambulatory Visit: Payer: Medicare HMO | Admitting: Family Medicine

## 2023-06-08 VITALS — BP 110/78 | HR 97 | Temp 98.5°F | Ht 64.0 in | Wt 172.2 lb

## 2023-06-08 DIAGNOSIS — K219 Gastro-esophageal reflux disease without esophagitis: Secondary | ICD-10-CM

## 2023-06-08 DIAGNOSIS — Z23 Encounter for immunization: Secondary | ICD-10-CM

## 2023-06-08 DIAGNOSIS — Z0000 Encounter for general adult medical examination without abnormal findings: Secondary | ICD-10-CM

## 2023-06-08 DIAGNOSIS — E538 Deficiency of other specified B group vitamins: Secondary | ICD-10-CM | POA: Diagnosis not present

## 2023-06-08 DIAGNOSIS — D369 Benign neoplasm, unspecified site: Secondary | ICD-10-CM

## 2023-06-08 DIAGNOSIS — Z780 Asymptomatic menopausal state: Secondary | ICD-10-CM

## 2023-06-08 DIAGNOSIS — M8580 Other specified disorders of bone density and structure, unspecified site: Secondary | ICD-10-CM

## 2023-06-08 DIAGNOSIS — I10 Essential (primary) hypertension: Secondary | ICD-10-CM | POA: Diagnosis not present

## 2023-06-08 LAB — CBC WITH DIFFERENTIAL/PLATELET
Basophils Absolute: 0 10*3/uL (ref 0.0–0.1)
Basophils Relative: 0.6 % (ref 0.0–3.0)
Eosinophils Absolute: 0 10*3/uL (ref 0.0–0.7)
Eosinophils Relative: 0.7 % (ref 0.0–5.0)
HCT: 40.7 % (ref 36.0–46.0)
Hemoglobin: 13.3 g/dL (ref 12.0–15.0)
Lymphocytes Relative: 33.7 % (ref 12.0–46.0)
Lymphs Abs: 2 10*3/uL (ref 0.7–4.0)
MCHC: 32.7 g/dL (ref 30.0–36.0)
MCV: 99.6 fl (ref 78.0–100.0)
Monocytes Absolute: 0.6 10*3/uL (ref 0.1–1.0)
Monocytes Relative: 9.9 % (ref 3.0–12.0)
Neutro Abs: 3.2 10*3/uL (ref 1.4–7.7)
Neutrophils Relative %: 55.1 % (ref 43.0–77.0)
Platelets: 337 10*3/uL (ref 150.0–400.0)
RBC: 4.09 Mil/uL (ref 3.87–5.11)
RDW: 13.4 % (ref 11.5–15.5)
WBC: 5.9 10*3/uL (ref 4.0–10.5)

## 2023-06-08 LAB — COMPREHENSIVE METABOLIC PANEL
ALT: 19 U/L (ref 0–35)
AST: 16 U/L (ref 0–37)
Albumin: 4.2 g/dL (ref 3.5–5.2)
Alkaline Phosphatase: 43 U/L (ref 39–117)
BUN: 12 mg/dL (ref 6–23)
CO2: 28 meq/L (ref 19–32)
Calcium: 10 mg/dL (ref 8.4–10.5)
Chloride: 97 meq/L (ref 96–112)
Creatinine, Ser: 0.83 mg/dL (ref 0.40–1.20)
GFR: 73.19 mL/min (ref >60.00)
Glucose, Bld: 93 mg/dL (ref 70–99)
Potassium: 4.1 meq/L (ref 3.5–5.1)
Sodium: 135 meq/L (ref 135–145)
Total Bilirubin: 0.7 mg/dL (ref 0.2–1.2)
Total Protein: 6.9 g/dL (ref 6.0–8.3)

## 2023-06-08 LAB — LIPID PANEL
Cholesterol: 233 mg/dL — ABNORMAL HIGH (ref 0–200)
HDL: 105.1 mg/dL (ref >39.00)
LDL Cholesterol: 105 mg/dL — ABNORMAL HIGH (ref 0–99)
NonHDL: 127.55
Total CHOL/HDL Ratio: 2
Triglycerides: 111 mg/dL (ref 0.0–149.0)
VLDL: 22.2 mg/dL (ref 0.0–40.0)

## 2023-06-08 LAB — TSH: TSH: 2.16 u[IU]/mL (ref 0.35–5.50)

## 2023-06-08 LAB — VITAMIN B12: Vitamin B-12: 774 pg/mL (ref 211–911)

## 2023-06-08 MED ORDER — OMEPRAZOLE 20 MG PO CPDR
20.0000 mg | DELAYED_RELEASE_CAPSULE | Freq: Every day | ORAL | 0 refills | Status: DC
  Filled 2023-06-08: qty 90, 90d supply, fill #0

## 2023-06-08 MED ORDER — OMEPRAZOLE 20 MG PO CPDR
20.0000 mg | DELAYED_RELEASE_CAPSULE | Freq: Every day | ORAL | 3 refills | Status: DC
  Filled 2023-06-08: qty 90, 90d supply, fill #0
  Filled 2023-11-03: qty 90, 90d supply, fill #1
  Filled 2024-02-22: qty 90, 90d supply, fill #2

## 2023-06-08 NOTE — Progress Notes (Signed)
Labs reviewed.  The ASCVD Risk score (Arnett DK, et al., 2019) failed to calculate for the following reasons:   The valid HDL cholesterol range is 20 to 100 mg/dL  Dear Kathy Shaw, Thank you for allowing me to care for you at your recent office visit.  I wanted to let you know that I have reviewed your lab test results and am happy to report that they are all normal.  Everything looks good!  Sincerely, Dr. Mardelle Matte

## 2023-06-08 NOTE — Patient Instructions (Signed)
Please return in 12 months for your annual complete physical; please come fasting.   I will release your lab results to you on your MyChart account with further instructions. You may see the results before I do, but when I review them I will send you a message with my report or have my assistant call you if things need to be discussed. Please reply to my message with any questions. Thank you!   If you have any questions or concerns, please don't hesitate to send me a message via MyChart or call the office at [phone]. Thank you for visiting with [place] today! It's our pleasure caring for you.   Calcium Intake Recommendations You can take Caltrate Plus twice a day or get it through your diet or other OTC supplements (Viactiv, OsCal etc)  Calcium is a mineral that affects many functions in the body, including: Blood clotting. Blood vessel function. Nerve impulse conduction. Hormone secretion. Muscle contraction. Bone and teeth functions.  Most of your body's calcium supply is stored in your bones and teeth. When your calcium stores are low, you may be at risk for low bone mass, bone loss, and bone fractures. Consuming enough calcium helps to grow healthy bones and teeth and to prevent breakdown over time. It is very important that you get enough calcium if you are: A child undergoing rapid growth. An adolescent girl. A pre- or post-menopausal woman. A woman whose menstrual cycle has stopped due to anorexia nervosa or regular intense exercise. An individual with lactose intolerance or a milk allergy. A vegetarian.  What is my plan? Try to consume the recommended amount of calcium daily based on your age. Depending on your overall health, your health care provider may recommend increased calcium intake. General daily calcium intake recommendations by age are: Birth to 6 months: 200 mg. Infants 7 to 12 months: 260 mg. Children 1 to 3 years: 700 mg. Children 4 to 8 years: 1,000 mg. Children  9 to 13 years: 1,300 mg. Teens 14 to 18 years: 1,300 mg. Adults 19 to 50 years: 1,000 mg. Adult women 51 to 70 years: 1,200 mg. Adult men 51 to 70 years: 1,000 mg. Adults 71 years and older: 1,200 mg. Pregnant and breastfeeding teens: 1,300 mg. Pregnant and breastfeeding adults: 1,000 mg.  What do I need to know about calcium intake? In order for the body to absorb calcium, it needs vitamin D. You can get vitamin D through (we recommend getting [phone] units of Vitamin D daily) Direct exposure of the skin to sunlight. Foods, such as egg yolks, liver, saltwater fish, and fortified milk. Supplements. Consuming too much calcium may cause: Constipation. Decreased absorption of iron and zinc. Kidney stones. Calcium supplements may interact with certain medicines. Check with your health care provider before starting any calcium supplements. Try to get most of your calcium from food. What foods can I eat? Grains  Fortified oatmeal. Fortified ready-to-eat cereals. Fortified frozen waffles. Vegetables Turnip greens. Broccoli. Fruits Fortified orange juice. Meats and Other Protein Sources Canned sardines with bones. Canned salmon with bones. Soy beans. Tofu. Baked beans. Almonds. [place] nuts. Sunflower seeds. Dairy Milk. Yogurt. Cheese. Cottage cheese. Beverages Fortified soy milk. Fortified rice milk. Sweets/Desserts Pudding. Ice Cream. Milkshakes. Blackstrap molasses. The items listed above may not be a complete list of recommended foods or beverages. Contact your dietitian for more options. What foods can affect my calcium intake? It may be more difficult for your body to use calcium or calcium may leave your  body more quickly if you consume large amounts of: Sodium. Protein. Caffeine. Alcohol.  This information is not intended to replace advice given to you by your health care provider. Make sure you discuss any questions you have with your health care provider. Document  Released: 05/04/2004 Document Revised: 04/09/2016 Document Reviewed: 02/26/2014 Elsevier Interactive Patient Education  2018 [employer].

## 2023-06-08 NOTE — Progress Notes (Signed)
Subjective  Chief Complaint  Patient presents with   Annual Exam    Pt here for Annual Exam and is not currently fasting    Hypertension    HPI: Kathy Shaw is a 67 y.o. female who presents to [facility] [place] today for a Female Wellness Visit. She also has the concerns and/or needs as listed above in the chief complaint. These will be addressed in addition to the Health Maintenance Visit.   Wellness Visit: annual visit with health maintenance review and exam without Pap  HM: mammo and CRC screen current; had tubular adenoma 2023 and needs repeat in 2026, Dr. Christella Hartigan. Feels well. No concerns. Flu shot today. Eye exam current. Feeling well. Youngest granddaughter started kindergarten!  Chronic disease f/u and/or acute problem visit: (deemed necessary to be done in addition to the wellness visit): HTN on ace/diuretic and stable. Checks at home and well controlled. Feeling well. Taking medications w/o adverse effects. No symptoms of CHF, angina; no palpitations, sob, cp or lower extremity edema. Compliant with meds.  On long term PPI< needs refill. Well controlled gerd. On b12 oral supplements due for recheck.  Reviewed 2023 dexa results showing osteopenia with lowest T = -2.3. takes vit D. Active.   Assessment  1. Annual physical exam   2. Essential hypertension   3. Vitamin B12 deficiency   4. GERD without esophagitis   5. Tubular adenoma   6. Osteopenia after menopause   7. Encounter for immunization      Plan  Female Wellness Visit: Age appropriate Health Maintenance and Prevention measures were discussed with patient. Included topics are cancer screening recommendations, ways to keep healthy (see AVS) including dietary and exercise recommendations, regular eye and dental care, use of seat belts, and avoidance of moderate alcohol use and tobacco use. Screens are up to date BMI: discussed patient's BMI and encouraged positive lifestyle modifications to help  get to or maintain a target BMI. HM needs and immunizations were addressed and ordered. See below for orders. See HM and immunization section for updates. Flu shot today Routine labs and screening tests ordered including cmp, cbc and lipids where appropriate. Discussed recommendations regarding Vit D and calcium supplementation (see AVS)  Chronic disease management visit and/or acute problem visit: HTN is well controlled on lisinopril hydrochlorothiazide 20/12.5 daily. Check renal function and electrolytes. Screen nonfasting lipids Monitor b12 levels on PPI GERD is well controlled on omeprazole 20 Osteopenia: discussed weight bearing exercises. Continue D and see AVS for info on calcium supplementation For colonoscopy surveillance next year for tubular adenoma and fh of colon cancer.  Follow up: 12 mo for cpe  Orders Placed This Encounter  Procedures   Flu Vaccine Trivalent High Dose (Fluad)   CBC with Differential/Platelet   Comprehensive metabolic panel   Lipid panel   TSH   Vitamin B12   Meds ordered this encounter  Medications   DISCONTD: omeprazole (PRILOSEC) 20 MG capsule    Sig: Take 1 capsule (20 mg total) by mouth daily.    Dispense:  90 capsule    Refill:  0   omeprazole (PRILOSEC) 20 MG capsule    Sig: Take 1 capsule (20 mg total) by mouth daily.    Dispense:  90 capsule    Refill:  3    Please update refills to 3 (disregard prior refill)      Body mass index is 29.56 kg/m. Wt Readings from Last 3 Encounters:  06/08/23 172 lb 3.2  oz (78.1 kg)  03/04/22 168 lb (76.2 kg)  09/01/21 167 lb 9.6 oz (76 kg)     Patient Active Problem List   Diagnosis Date Noted Date Diagnosed   Osteopenia after menopause 09/09/2022     Dexa 09/2022: lowest T  = - 2.3 at spine; osteopenia; usual recs and recheck in 2 years.     Vitamin B12 deficiency 09/01/2021    Long-term current use of proton pump inhibitor therapy 09/01/2021    Tubular adenoma 01/14/2020     Dr. Christella Hartigan,  colonoscopy 2010;  2022 tubular adenoma and serrated polyps; recall in 2025    Essential hypertension 09/18/2018    GERD without esophagitis 09/18/2018    Family history of colon cancer 09/16/2017     Maternal aunt    Family history of malignant melanoma 09/16/2017    Family history of thyroid cancer 09/16/2017     Mother    Family history of breast cancer in mother     Health Maintenance  Topic Date Due   Medicare Annual Wellness (AWV)  06/30/2023   COVID-19 Vaccine (4 - 2023-24 season) 06/24/2023 (Originally 06/05/2023)   Colonoscopy  12/30/2023   MAMMOGRAM  03/06/2024   DEXA SCAN  08/31/2024   DTaP/Tdap/Td (2 - Td or Tdap) 06/28/2027   Pneumonia Vaccine 23+ Years old  Completed   INFLUENZA VACCINE  Completed   Hepatitis C Screening  Completed   Zoster Vaccines- Shingrix  Completed   HPV VACCINES  Aged Out   Immunization History  Administered Date(s) Administered   Fluad Quad(high Dose 65+) 09/01/2021   Fluad Trivalent(High Dose 65+) 06/08/2023   Influenza,inj,Quad PF,6+ Mos 10/14/2022   Influenza-Unspecified 07/01/2017, 06/27/2019, 08/04/2020   PFIZER(Purple Top)SARS-COV-2 Vaccination 09/30/2019, 10/22/2019, 09/03/2020   PNEUMOCOCCAL CONJUGATE-20 09/01/2021   Tdap 06/27/2017   Zoster Recombinant(Shingrix) 09/18/2018, 01/14/2020   We updated and reviewed the patient's past history in detail and it is documented below. Allergies: Patient has No Known Allergies. Past Medical History Patient  has a past medical history of Allergy, Family history of breast cancer in mother, Family history of colon cancer (09/16/2017), GERD (gastroesophageal reflux disease), Heart murmur, History of chicken pox, History of tear of ACL (anterior cruciate ligament), and Hypertension. Past Surgical History Patient  has a past surgical history that includes Knee arthroscopy with anterior cruciate ligament (acl) repair (Left, 2005); Breast surgery (Left); Tonsillectomy (1961); Wisdom tooth  extraction (Bilateral); Breast excisional biopsy (Left, 2008); Colonoscopy (08/2009-DJ); and Polypectomy. Family History: Patient family history includes Breast cancer in her maternal grandmother and mother; Cancer in her mother; Colon cancer in her maternal aunt; Diabetes in her father; Healthy in her daughter, daughter, sister, sister, sister, and son; Heart disease in her father; Hypertension in her father; Melanoma in her mother; Myasthenia gravis in her father; Thyroid cancer in her mother. Social History:  Patient  reports that she has quit smoking. Her smoking use included cigarettes. She has a 1.5 pack-year smoking history. She has never used smokeless tobacco. She reports current alcohol use of about 14.0 standard drinks of alcohol per week. She reports that she does not use drugs.  Review of Systems: Constitutional: negative for fever or malaise Ophthalmic: negative for photophobia, double vision or loss of vision Cardiovascular: negative for chest pain, dyspnea on exertion, or new LE swelling Respiratory: negative for SOB or persistent cough Gastrointestinal: negative for abdominal pain, change in bowel habits or melena Genitourinary: negative for dysuria or gross hematuria, no abnormal uterine bleeding or disharge Musculoskeletal: negative for  new gait disturbance or muscular weakness Integumentary: negative for new or persistent rashes, no breast lumps Neurological: negative for TIA or stroke symptoms Psychiatric: negative for SI or delusions Allergic/Immunologic: negative for hives  Patient Care Team    Relationship Specialty Notifications Start End  Willow Ora, MD PCP - General Family Medicine  09/16/17   Rachael Fee, MD Consulting Physician Gastroenterology  01/14/20     Objective  Vitals: BP 110/78 Comment: by home reading this am  Pulse 97   Temp 98.5 F (36.9 C)   Ht 5\' 4"  (1.626 m)   Wt 172 lb 3.2 oz (78.1 kg)   SpO2 98%   BMI 29.56 kg/m  General:  Well  developed, well nourished, no acute distress  Psych:  Alert and orientedx3,normal mood and affect HEENT:  Normocephalic, atraumatic, non-icteric sclera,  supple neck without adenopathy, mass or thyromegaly Cardiovascular:  Normal S1, S2, RRR without gallop, rub or murmur Respiratory:  Good breath sounds bilaterally, CTAB with normal respiratory effort Gastrointestinal: normal bowel sounds, soft, non-tender, no noted masses. No HSM MSK: extremities without edema, joints without erythema or swelling Neurologic:    Mental status is normal.  Gross motor and sensory exams are normal.  No tremor  Commons side effects, risks, benefits, and alternatives for medications and treatment plan prescribed today were discussed, and the patient expressed understanding of the given instructions. Patient is instructed to call or message via MyChart if he/she has any questions or concerns regarding our treatment plan. No barriers to understanding were identified. We discussed Red Flag symptoms and signs in detail. Patient expressed understanding regarding what to do in case of urgent or emergency type symptoms.  Medication list was reconciled, printed and provided to the patient in AVS. Patient instructions and summary information was reviewed with the patient as documented in the AVS. This note was prepared with assistance of Dragon voice recognition software. Occasional wrong-word or sound-a-like substitutions may have occurred due to the inherent limitations of voice recognition software

## 2023-06-13 ENCOUNTER — Other Ambulatory Visit (HOSPITAL_BASED_OUTPATIENT_CLINIC_OR_DEPARTMENT_OTHER): Payer: Self-pay

## 2023-07-12 ENCOUNTER — Other Ambulatory Visit (HOSPITAL_BASED_OUTPATIENT_CLINIC_OR_DEPARTMENT_OTHER): Payer: Self-pay

## 2023-08-17 DIAGNOSIS — R69 Illness, unspecified: Secondary | ICD-10-CM | POA: Diagnosis not present

## 2023-08-18 DIAGNOSIS — H524 Presbyopia: Secondary | ICD-10-CM | POA: Diagnosis not present

## 2023-09-16 DIAGNOSIS — R69 Illness, unspecified: Secondary | ICD-10-CM | POA: Diagnosis not present

## 2023-11-03 ENCOUNTER — Other Ambulatory Visit (HOSPITAL_BASED_OUTPATIENT_CLINIC_OR_DEPARTMENT_OTHER): Payer: Self-pay

## 2023-11-03 ENCOUNTER — Other Ambulatory Visit: Payer: Self-pay | Admitting: Family Medicine

## 2023-11-03 MED ORDER — LISINOPRIL-HYDROCHLOROTHIAZIDE 20-12.5 MG PO TABS
1.0000 | ORAL_TABLET | Freq: Every day | ORAL | 3 refills | Status: AC
  Filled 2023-11-03: qty 90, 90d supply, fill #0
  Filled 2024-02-22: qty 90, 90d supply, fill #1
  Filled 2024-06-11: qty 90, 90d supply, fill #2
  Filled 2024-09-08: qty 90, 90d supply, fill #3

## 2024-01-05 ENCOUNTER — Encounter: Payer: Self-pay | Admitting: Pediatrics

## 2024-01-05 ENCOUNTER — Encounter: Payer: Self-pay | Admitting: Family Medicine

## 2024-01-24 ENCOUNTER — Other Ambulatory Visit: Payer: Self-pay | Admitting: Family Medicine

## 2024-01-24 DIAGNOSIS — Z1231 Encounter for screening mammogram for malignant neoplasm of breast: Secondary | ICD-10-CM

## 2024-02-13 ENCOUNTER — Encounter: Admitting: Pediatrics

## 2024-02-22 ENCOUNTER — Other Ambulatory Visit (HOSPITAL_BASED_OUTPATIENT_CLINIC_OR_DEPARTMENT_OTHER): Payer: Self-pay

## 2024-02-22 ENCOUNTER — Ambulatory Visit (AMBULATORY_SURGERY_CENTER)

## 2024-02-22 ENCOUNTER — Encounter: Payer: Self-pay | Admitting: Pediatrics

## 2024-02-22 VITALS — Ht 64.0 in | Wt 170.0 lb

## 2024-02-22 DIAGNOSIS — Z860100 Personal history of colon polyps, unspecified: Secondary | ICD-10-CM

## 2024-02-22 DIAGNOSIS — Z800 Family history of malignant neoplasm of digestive organs: Secondary | ICD-10-CM

## 2024-02-22 MED ORDER — NA SULFATE-K SULFATE-MG SULF 17.5-3.13-1.6 GM/177ML PO SOLN
1.0000 | Freq: Once | ORAL | 0 refills | Status: AC
Start: 2024-02-22 — End: 2024-02-29
  Filled 2024-02-22: qty 354, 1d supply, fill #0

## 2024-02-22 NOTE — Progress Notes (Signed)
 No egg or soy allergy known to patient  No issues known to pt with past sedation with any surgeries or procedures Patient denies ever being told they had issues or difficulty with intubation  No FH of Malignant Hyperthermia Pt is not on diet pills Pt is not on  home 02  Pt is not on blood thinners  Pt denies issues with constipation  No A fib or A flutter Have any cardiac testing pending--no Pt can ambulate independently Pt denies use of chewing tobacco Discussed diabetic I weight loss medication holds Discussed NSAID holds Checked BMI Pt instructed to use Singlecare.com or GoodRx for a price reduction on prep  Patient's chart reviewed by Kathy Shaw CNRA prior to previsit and patient appropriate for the [facility].  Pre visit completed and red dot placed by patient's name on their procedure day (on provider's schedule).

## 2024-02-23 ENCOUNTER — Encounter: Admitting: Pediatrics

## 2024-03-09 ENCOUNTER — Ambulatory Visit
Admission: RE | Admit: 2024-03-09 | Discharge: 2024-03-09 | Disposition: A | Discharge status: Home / Self Care | Payer: Self-pay | Source: Ambulatory Visit | Attending: Family Medicine | Admitting: Family Medicine

## 2024-03-09 ENCOUNTER — Ambulatory Visit: Payer: Self-pay

## 2024-03-09 DIAGNOSIS — Z1231 Encounter for screening mammogram for malignant neoplasm of breast: Secondary | ICD-10-CM | POA: Diagnosis not present

## 2024-03-11 NOTE — Progress Notes (Unsigned)
 [facility] History and Physical   Primary Care Physician:  Luevenia Saha, MD   Reason for Procedure:  Surveillance of adenomatous and sessile serrated colon polyps   Plan:    Colonoscopy     HPI: Kathy Shaw is a 68 y.o. female undergoing colonoscopy for surveillance of adenomatous and sessile serrated colon polyps.  Patient underwent last colonoscopy in 2022.  This disclosed six 2-8 mm colon polyps that were found to be tubular adenomas and sessile serrated polyps on histology.  Chart documents a family history of colorectal cancer and polyps in a maternal aunt.  Patient denies current symptoms of change in bowel habits or rectal bleeding.   Past Medical History:  Diagnosis Date   Allergy    Family history of breast cancer in mother    Family history of colon cancer 09/16/2017   Maternal aunt   GERD (gastroesophageal reflux disease)    History of chicken pox    History of tear of ACL (anterior cruciate ligament)    left   Hypertension     Past Surgical History:  Procedure Laterality Date   BREAST EXCISIONAL BIOPSY Left 2008   Dr. Linell Rhymes   BREAST SURGERY Left    Atypical cells removed   COLONOSCOPY  08/2009-DJ   KNEE ARTHROSCOPY WITH ANTERIOR CRUCIATE LIGAMENT (ACL) REPAIR Left 2005   POLYPECTOMY     TONSILLECTOMY  1961   WISDOM TOOTH EXTRACTION Bilateral     Prior to Admission medications   Medication Sig Start Date End Date Taking? Authorizing Provider  Cholecalciferol (D3-1000) 25 MCG (1000 UT) tablet Take 1,000 Units by mouth daily.    [provider]  fluticasone (FLONASE) 50 MCG/ACT nasal spray Place 1 spray into both nostrils as needed for allergies or rhinitis.    [provider]  lisinopril -hydrochlorothiazide  (ZESTORETIC ) 20-12.5 MG tablet Take 1 tablet by mouth daily. 11/03/23   Luevenia Saha, MD  omeprazole  (PRILOSEC) 20 MG capsule Take 1 capsule (20 mg total) by mouth daily. 06/08/23   Luevenia Saha, MD  vitamin B-12  (CYANOCOBALAMIN ) 1000 MCG tablet Take 1,000 mcg by mouth daily.    [provider]    Current Outpatient Medications  Medication Sig Dispense Refill   Cholecalciferol (D3-1000) 25 MCG (1000 UT) tablet Take 1,000 Units by mouth daily.     lisinopril -hydrochlorothiazide  (ZESTORETIC ) 20-12.5 MG tablet Take 1 tablet by mouth daily. 90 tablet 3   omeprazole  (PRILOSEC) 20 MG capsule Take 1 capsule (20 mg total) by mouth daily. 90 capsule 3   vitamin B-12 (CYANOCOBALAMIN ) 1000 MCG tablet Take 1,000 mcg by mouth daily.     fluticasone (FLONASE) 50 MCG/ACT nasal spray Place 1 spray into both nostrils as needed for allergies or rhinitis.     Current Facility-Administered Medications  Medication Dose Route Frequency Provider Last Rate Last Admin   0.9 %  sodium chloride  infusion  500 mL Intravenous Once Collyns Mcquigg, Scarlette Currier, MD        Allergies as of 03/12/2024   (No Known Allergies)    Family History  Problem Relation Age of Onset   Breast cancer Mother    Thyroid  cancer Mother    Melanoma Mother    Cancer Mother    Myasthenia gravis Father    Hypertension Father    Diabetes Father    Heart disease Father    Healthy Sister    Healthy Sister    Healthy Sister    Colon polyps Maternal Aunt  Colon cancer Maternal Aunt    Breast cancer Maternal Grandmother    Healthy Daughter    Healthy Daughter    Healthy Son    Esophageal cancer Neg Hx    Rectal cancer Neg Hx    Stomach cancer Neg Hx     Social History   Socioeconomic History   Marital status: Married    Spouse name: Dwyane Glad   Number of children: 3   Years of education: Not on file   Highest education level: Not on file  Occupational History   Occupation: Charity fundraiser, NICU    Employer: [employer]  Tobacco Use   Smoking status: Former    Current packs/day: 0.50    Average packs/day: 0.5 packs/day for 3.0 years (1.5 ttl pk-yrs)    Types: Cigarettes   Smokeless tobacco: Never   Tobacco comments:    quit 1981  Vaping Use    Vaping status: Never Used  Substance and Sexual Activity   Alcohol use: Yes    Alcohol/week: 14.0 standard drinks of alcohol    Types: 14 Glasses of wine per week   Drug use: No   Sexual activity: Yes    Birth control/protection: None, Post-menopausal  Other Topics Concern   Not on file  Social History Narrative   Retired Hotel manager, 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   Social Drivers of Health   Financial Resource Strain: Low Risk  (06/29/2022)   Overall Financial Resource Strain (CARDIA)    Difficulty of Paying Living Expenses: Not hard at all  Food Insecurity: No Food Insecurity (06/29/2022)   Hunger Vital Sign    Worried About Running Out of Food in the Last Year: Never true    Ran Out of Food in the Last Year: Never true  Transportation Needs: No Transportation Needs (06/29/2022)   PRAPARE - Administrator, Civil Service (Medical): No    Lack of Transportation (Non-Medical): No  Physical Activity: Sufficiently Active (06/29/2022)   Exercise Vital Sign    Days of Exercise per Week: 5 days    Minutes of Exercise per Session: 30 min  Stress: No Stress Concern Present (06/29/2022)   [employer] of Occupational Health - Occupational Stress Questionnaire    Feeling of Stress : Not at all  Social Connections: Moderately Integrated (06/29/2022)   Social Connection and Isolation Panel [NHANES]    Frequency of Communication with Friends and Family: Three times a week    Frequency of Social Gatherings with Friends and Family: Three times a week    Attends Religious Services: Never    Active Member of Clubs or Organizations: Yes    Attends Banker Meetings: 1 to 4 times per year    Marital Status: Married  Catering manager Violence: Not At Risk (06/29/2022)   Humiliation, Afraid, Rape, and Kick questionnaire    Fear of Current or Ex-Partner: No    Emotionally Abused: No    Physically Abused: No    Sexually Abused: No    Review of Systems:  All other review of systems  negative except as mentioned in the HPI.  Physical Exam: Vital signs BP (!) 163/96   Pulse 74   Temp (!) 97.3 F (36.3 C)   Resp 14   Ht 5\' 4"  (1.626 m)   Wt 170 lb (77.1 kg)   SpO2 99%   BMI 29.18 kg/m   General:   Alert,  Well-developed, well-nourished, pleasant and cooperative in NAD Airway:  Mallampati 3 Lungs:  Clear throughout to auscultation.  Heart:  Regular rate and rhythm; no murmurs, clicks, rubs,  or gallops. Abdomen:  Soft, nontender and nondistended. Normal bowel sounds.   Neuro/Psych:  Normal mood and affect. A and O x 3  Eugenia Hess, MD [facility]

## 2024-03-12 ENCOUNTER — Ambulatory Visit (AMBULATORY_SURGERY_CENTER): Admitting: Pediatrics

## 2024-03-12 ENCOUNTER — Encounter: Payer: Self-pay | Admitting: Pediatrics

## 2024-03-12 VITALS — BP 122/64 | HR 68 | Temp 97.3°F | Resp 16 | Ht 64.0 in | Wt 170.0 lb

## 2024-03-12 DIAGNOSIS — K648 Other hemorrhoids: Secondary | ICD-10-CM | POA: Diagnosis not present

## 2024-03-12 DIAGNOSIS — K5730 Diverticulosis of large intestine without perforation or abscess without bleeding: Secondary | ICD-10-CM | POA: Diagnosis not present

## 2024-03-12 DIAGNOSIS — Z860101 Personal history of adenomatous and serrated colon polyps: Secondary | ICD-10-CM

## 2024-03-12 DIAGNOSIS — Z1211 Encounter for screening for malignant neoplasm of colon: Secondary | ICD-10-CM | POA: Diagnosis not present

## 2024-03-12 DIAGNOSIS — K644 Residual hemorrhoidal skin tags: Secondary | ICD-10-CM | POA: Diagnosis not present

## 2024-03-12 DIAGNOSIS — Z800 Family history of malignant neoplasm of digestive organs: Secondary | ICD-10-CM | POA: Diagnosis not present

## 2024-03-12 DIAGNOSIS — I10 Essential (primary) hypertension: Secondary | ICD-10-CM | POA: Diagnosis not present

## 2024-03-12 DIAGNOSIS — Z860100 Personal history of colon polyps, unspecified: Secondary | ICD-10-CM

## 2024-03-12 MED ORDER — SODIUM CHLORIDE 0.9 % IV SOLN: 500.0000 mL | Freq: Once | INTRAVENOUS | Status: DC

## 2024-03-12 NOTE — Op Note (Signed)
 [facility] Patient Name: Kathy Shaw Procedure Date: 03/12/2024 7:50 AM MRN: [id number] Endoscopist: Eugenia Hess , MD, [id number] Age: 68 Referring MD:  Date of Birth: [birth year] Gender: Female Account #: [id number] Procedure:                Colonoscopy Indications:              High risk colon cancer surveillance: Personal                            history of multiple (3 or more) adenomas, High risk                            colon cancer surveillance: Personal history of                            sessile serrated colon polyp (less than 10 mm in                            size) with no dysplasia, Last colonoscopy: March                            2022 Medicines:                Monitored Anesthesia Care Procedure:                Pre-Anesthesia Assessment:                           - Prior to the procedure, a History and Physical                            was performed, and patient medications and                            allergies were reviewed. The patient's tolerance of                            previous anesthesia was also reviewed. The risks                            and benefits of the procedure and the sedation                            options and risks were discussed with the patient.                            All questions were answered, and informed consent                            was obtained. Prior Anticoagulants: The patient has                            taken no anticoagulant or antiplatelet agents. ASA  Grade Assessment: II - A patient with mild systemic                            disease. After reviewing the risks and benefits,                            the patient was deemed in satisfactory condition to                            undergo the procedure.                           After obtaining informed consent, the colonoscope                            was passed under direct vision. Throughout the                             procedure, the patient's blood pressure, pulse, and                            oxygen saturations were monitored continuously. The                            Olympus Scope SN: [id number] was introduced through                            the anus and advanced to the cecum, identified by                            appendiceal orifice and ileocecal valve. The                            colonoscopy was performed without difficulty. The                            patient tolerated the procedure well. The quality                            of the bowel preparation was good. The ileocecal                            valve, appendiceal orifice, and rectum were                            photographed. Scope In: 8:01:36 AM Scope Out: 8:16:52 AM Scope Withdrawal Time: 0 hours 13 minutes 8 seconds  Total Procedure Duration: 0 hours 15 minutes 16 seconds  Findings:                 Hemorrhoids were found on perianal exam.                           The digital rectal exam was normal. Pertinent  negatives include normal sphincter tone and no                            palpable rectal lesions.                           Multiple small-mouthed diverticula were found in                            the sigmoid colon, descending colon and ascending                            colon.                           The exam was otherwise normal throughout the                            examined colon.                           Internal hemorrhoids were found during retroflexion. Complications:            No immediate complications. Estimated blood loss:                            None. Estimated Blood Loss:     Estimated blood loss: none. Impression:               - Hemorrhoids found on perianal exam.                           - Diverticulosis in the sigmoid colon, in the                            descending colon and in the ascending colon.                           - Internal  hemorrhoids.                           - No specimens collected. Recommendation:           - Discharge patient to home (ambulatory).                           - Repeat colonoscopy in 5 years for surveillance.                           - The findings and recommendations were discussed                            with the patient's family.                           - Return to referring physician.                           -  Patient has a contact number available for                            emergencies. The signs and symptoms of potential                            delayed complications were discussed with the                            patient. Return to normal activities tomorrow.                            Written discharge instructions were provided to the                            patient. Eugenia Hess, MD 03/12/2024 8:22:56 AM This report has been signed electronically.

## 2024-03-12 NOTE — Progress Notes (Signed)
 Report to PACU, RN, vss, BBS= Clear.

## 2024-03-12 NOTE — Patient Instructions (Signed)

## 2024-03-12 NOTE — Progress Notes (Signed)
 Pt's states no medical or surgical changes since previsit or office visit.

## 2024-03-13 ENCOUNTER — Telehealth: Payer: Self-pay

## 2024-03-13 NOTE — Telephone Encounter (Signed)
  Follow up Call-     03/12/2024    7:45 AM  Call back number  Post procedure Call Back phone  # [phone]  Permission to leave phone message Yes     Patient questions:  Do you have a fever, pain , or abdominal swelling? No. Pain Score  0 *  Have you tolerated food without any problems? Yes.    Have you been able to return to your normal activities? Yes.    Do you have any questions about your discharge instructions: Diet   No. Medications  No. Follow up visit  No.  Do you have questions or concerns about your Care? No.  Actions: * If pain score is 4 or above: No action needed, pain <4.

## 2024-04-17 ENCOUNTER — Ambulatory Visit: Admitting: Orthopaedic Surgery

## 2024-04-17 ENCOUNTER — Other Ambulatory Visit (INDEPENDENT_AMBULATORY_CARE_PROVIDER_SITE_OTHER): Payer: Self-pay

## 2024-04-17 DIAGNOSIS — M79675 Pain in left toe(s): Secondary | ICD-10-CM | POA: Diagnosis not present

## 2024-04-17 NOTE — Progress Notes (Signed)
 Office Visit Note   Patient: Kathy Shaw           Date of Birth: [birth year]           MRN: [id number] Visit Date: 04/17/2024              Requested by: Jodie Lavern CROME, MD [address] [place],  [place] [address] PCP: Jodie Lavern CROME, MD   Assessment & Plan: Visit Diagnoses:  1. Pain of toe of left foot     Plan: History of Present Illness The patient presents with a second toe injury after stubbing it on a chair. They are accompanied by their mother, Maelani.  Three weeks ago, they injured their second toe by running it into a chair. Persistent swelling and discomfort have been present since the injury. Initially, the pain improved, but there has been no significant improvement in the last week. Walking exacerbates the symptoms. They attempted buddy taping the toe to the adjacent one without success. They have been wearing shoes instead of flip flops to provide more support.  Physical Exam MUSCULOSKELETAL: Toe movement intact, sensation intact in toes, second toe aligned, swelling present in second toe.  Results RADIOLOGY Foot X-ray: Fracture of the second toe with good alignment, fracture line resembling a [employer] sign (03/27/2024)  Assessment and Plan Fracture of second toe Fracture healing well with persistent moderate swelling and soft tissue inflammation. Pain persists, exacerbated by activity. - Advise buddy taping the second toe to the adjacent toe if tolerated. - Recommend purchasing a carbon foot insert to stiffen the shoe and reduce toe flexion. - Suggest wearing shoes with a stiff sole for additional support. - Instruct to rest, elevate, and ice the foot as needed. - Recommend taking ibuprofen for pain management.  Follow-Up Instructions: No follow-ups on file.   Orders:  Orders Placed This Encounter  Procedures   XR Toe 2nd Left   No orders of the defined types were placed in this encounter.     Procedures: No procedures  performed   Clinical Data: No additional findings.   Subjective: Chief Complaint  Patient presents with   Left Foot - Pain    HPI  Review of Systems   Objective: Vital Signs: There were no vitals taken for this visit.  Physical Exam  Ortho Exam  Specialty Comments:  No specialty comments available.  Imaging: XR Toe 2nd Left Result Date: 04/17/2024 X-rays of the left second toe show a comminuted intra-articular fracture of the proximal phalanx.  Alignment is satisfactory.    PMFS History: Patient Active Problem List   Diagnosis Date Noted   Osteopenia after menopause 09/09/2022   Vitamin B12 deficiency 09/01/2021   Long-term current use of proton pump inhibitor therapy 09/01/2021   Tubular adenoma 01/14/2020   Essential hypertension 09/18/2018   GERD without esophagitis 09/18/2018   Family history of colon cancer 09/16/2017   Family history of malignant melanoma 09/16/2017   Family history of thyroid  cancer 09/16/2017   Family history of breast cancer in mother    Past Medical History:  Diagnosis Date   Allergy    Family history of breast cancer in mother    Family history of colon cancer 09/16/2017   Maternal aunt   GERD (gastroesophageal reflux disease)    History of chicken pox    History of tear of ACL (anterior cruciate ligament)    left   Hypertension     Family History  Problem Relation Age of Onset  Breast cancer Mother    Thyroid  cancer Mother    Melanoma Mother    Cancer Mother    Myasthenia gravis Father    Hypertension Father    Diabetes Father    Heart disease Father    Healthy Sister    Healthy Sister    Healthy Sister    Colon polyps Maternal Aunt    Colon cancer Maternal Aunt    Breast cancer Maternal Grandmother    Healthy Daughter    Healthy Daughter    Healthy Son    Esophageal cancer Neg Hx    Rectal cancer Neg Hx    Stomach cancer Neg Hx     Past Surgical History:  Procedure Laterality Date   BREAST EXCISIONAL  BIOPSY Left 2008   Dr. Merrilyn   BREAST SURGERY Left    Atypical cells removed   COLONOSCOPY  08/2009-DJ   KNEE ARTHROSCOPY WITH ANTERIOR CRUCIATE LIGAMENT (ACL) REPAIR Left 2005   POLYPECTOMY     TONSILLECTOMY  1961   WISDOM TOOTH EXTRACTION Bilateral    Social History   Occupational History   Occupation: Charity fundraiser, NICU    Employer: [employer]  Tobacco Use   Smoking status: Former    Current packs/day: 0.50    Average packs/day: 0.5 packs/day for 3.0 years (1.5 ttl pk-yrs)    Types: Cigarettes   Smokeless tobacco: Never   Tobacco comments:    quit 1981  Vaping Use   Vaping status: Never Used  Substance and Sexual Activity   Alcohol use: Yes    Alcohol/week: 14.0 standard drinks of alcohol    Types: 14 Glasses of wine per week   Drug use: No   Sexual activity: Yes    Birth control/protection: None, Post-menopausal

## 2024-06-11 ENCOUNTER — Ambulatory Visit: Payer: Medicare HMO | Admitting: Family Medicine

## 2024-06-11 ENCOUNTER — Encounter: Payer: Self-pay | Admitting: Family Medicine

## 2024-06-11 ENCOUNTER — Other Ambulatory Visit (HOSPITAL_BASED_OUTPATIENT_CLINIC_OR_DEPARTMENT_OTHER): Payer: Self-pay

## 2024-06-11 VITALS — BP 125/79 | HR 76 | Temp 97.7°F | Ht 65.0 in | Wt 172.4 lb

## 2024-06-11 DIAGNOSIS — Z0001 Encounter for general adult medical examination with abnormal findings: Secondary | ICD-10-CM

## 2024-06-11 DIAGNOSIS — Z79899 Other long term (current) drug therapy: Secondary | ICD-10-CM | POA: Diagnosis not present

## 2024-06-11 DIAGNOSIS — M8580 Other specified disorders of bone density and structure, unspecified site: Secondary | ICD-10-CM

## 2024-06-11 DIAGNOSIS — Z780 Asymptomatic menopausal state: Secondary | ICD-10-CM

## 2024-06-11 DIAGNOSIS — Z800 Family history of malignant neoplasm of digestive organs: Secondary | ICD-10-CM

## 2024-06-11 DIAGNOSIS — K219 Gastro-esophageal reflux disease without esophagitis: Secondary | ICD-10-CM

## 2024-06-11 DIAGNOSIS — D369 Benign neoplasm, unspecified site: Secondary | ICD-10-CM | POA: Diagnosis not present

## 2024-06-11 DIAGNOSIS — I10 Essential (primary) hypertension: Secondary | ICD-10-CM | POA: Diagnosis not present

## 2024-06-11 DIAGNOSIS — Z23 Encounter for immunization: Secondary | ICD-10-CM

## 2024-06-11 DIAGNOSIS — E538 Deficiency of other specified B group vitamins: Secondary | ICD-10-CM | POA: Diagnosis not present

## 2024-06-11 LAB — COMPREHENSIVE METABOLIC PANEL WITH GFR
ALT: 26 U/L (ref 0–35)
AST: 23 U/L (ref 0–37)
Albumin: 4.3 g/dL (ref 3.5–5.2)
Alkaline Phosphatase: 46 U/L (ref 39–117)
BUN: 9 mg/dL (ref 6–23)
CO2: 29 meq/L (ref 19–32)
Calcium: 9.8 mg/dL (ref 8.4–10.5)
Chloride: 96 meq/L (ref 96–112)
Creatinine, Ser: 0.85 mg/dL (ref 0.40–1.20)
GFR: 70.62 mL/min (ref >60.00)
Glucose, Bld: 92 mg/dL (ref 70–99)
Potassium: 3.9 meq/L (ref 3.5–5.1)
Sodium: 134 meq/L — ABNORMAL LOW (ref 135–145)
Total Bilirubin: 0.6 mg/dL (ref 0.2–1.2)
Total Protein: 6.8 g/dL (ref 6.0–8.3)

## 2024-06-11 LAB — CBC WITH DIFFERENTIAL/PLATELET
Basophils Absolute: 0 K/uL (ref 0.0–0.1)
Basophils Relative: 0.6 % (ref 0.0–3.0)
Eosinophils Absolute: 0.1 K/uL (ref 0.0–0.7)
Eosinophils Relative: 1.5 % (ref 0.0–5.0)
HCT: 39.1 % (ref 36.0–46.0)
Hemoglobin: 13.4 g/dL (ref 12.0–15.0)
Lymphocytes Relative: 36 % (ref 12.0–46.0)
Lymphs Abs: 1.9 K/uL (ref 0.7–4.0)
MCHC: 34.2 g/dL (ref 30.0–36.0)
MCV: 97.3 fl (ref 78.0–100.0)
Monocytes Absolute: 0.7 K/uL (ref 0.1–1.0)
Monocytes Relative: 12.6 % — ABNORMAL HIGH (ref 3.0–12.0)
Neutro Abs: 2.6 K/uL (ref 1.4–7.7)
Neutrophils Relative %: 49.3 % (ref 43.0–77.0)
Platelets: 289 K/uL (ref 150.0–400.0)
RBC: 4.02 Mil/uL (ref 3.87–5.11)
RDW: 14.1 % (ref 11.5–15.5)
WBC: 5.3 K/uL (ref 4.0–10.5)

## 2024-06-11 LAB — VITAMIN D 25 HYDROXY (VIT D DEFICIENCY, FRACTURES): VITD: 87.76 ng/mL (ref 30.00–100.00)

## 2024-06-11 LAB — LIPID PANEL
Cholesterol: 224 mg/dL — ABNORMAL HIGH (ref 0–200)
HDL: 125.1 mg/dL (ref >39.00)
LDL Cholesterol: 85 mg/dL (ref 0–99)
NonHDL: 98.4
Total CHOL/HDL Ratio: 2
Triglycerides: 68 mg/dL (ref 0.0–149.0)
VLDL: 13.6 mg/dL (ref 0.0–40.0)

## 2024-06-11 LAB — VITAMIN B12: Vitamin B-12: 657 pg/mL (ref 211–911)

## 2024-06-11 LAB — TSH: TSH: 2.83 u[IU]/mL (ref 0.35–5.50)

## 2024-06-11 MED ORDER — OMEPRAZOLE 20 MG PO CPDR
20.0000 mg | DELAYED_RELEASE_CAPSULE | Freq: Every day | ORAL | 3 refills | Status: AC
  Filled 2024-06-11: qty 90, 90d supply, fill #0
  Filled 2024-09-08: qty 90, 90d supply, fill #1

## 2024-06-11 NOTE — Progress Notes (Signed)
 Subjective  Chief Complaint  Patient presents with   Annual Exam    HPI: Kathy Shaw is a 68 y.o. female who presents to [facility] today for a Female Wellness Visit. She also has the concerns and/or needs as listed above in the chief complaint. These will be addressed in addition to the Health Maintenance Visit.   Wellness Visit: annual visit with health maintenance review and exam  Health maintenance: Mammogram normal and up-to-date.  Colonoscopy 2025: Normal except for diverticulosis.  Positive family history of colon cancer so repeat in 5 years, 2030.  Reviewed last bone density 2023, osteopenia.  Eligible for flu vaccination today.  Other immunizations are current. Chronic disease f/u and/or acute problem visit: (deemed necessary to be done in addition to the wellness visit): Essential hypertension managed with lisinopril  HCTZ.  No chest pain or shortness of breath.  Compliant with medication. Chronic GERD on chronic PPI: Well-controlled on omeprazole .  Secondary vitamin B12 deficiency on oral supplements.  Due for recheck.  Assessment  1. Encounter for well adult exam with abnormal findings   2. Essential hypertension   3. Family history of colon cancer   4. Tubular adenoma   5. GERD without esophagitis   6. Osteopenia after menopause   7. Long-term current use of proton pump inhibitor therapy   8. Vitamin B12 deficiency   9. Asymptomatic menopausal state   10. Need for influenza vaccination      Plan  Female Wellness Visit: Age appropriate Health Maintenance and Prevention measures were discussed with patient. Included topics are cancer screening recommendations, ways to keep healthy (see AVS) including dietary and exercise recommendations, regular eye and dental care, use of seat belts, and avoidance of moderate alcohol use and tobacco use.  Screens are current topical BMI: discussed patient's BMI and encouraged positive lifestyle modifications  to help get to or maintain a target BMI. HM needs and immunizations were addressed and ordered. See below for orders. See HM and immunization section for updates.  Flu shot today offered. Routine labs and screening tests ordered including cmp, cbc and lipids where appropriate. Discussed recommendations regarding Vit D and calcium supplementation (see AVS)  Chronic disease management visit and/or acute problem visit: Hypertension is well-controlled.  Continue lisinopril  HCTZ.  Check renal function electrolytes and lipids today. Chronic GERD, continue omeprazole .    vitamin B12 deficiency on oral supplements due for recheck today. family history of colon cancer: Colonoscopy screens are current and fortunately normal.  Recheck 5 years Osteopenia: Ordered bone density, due in November.  Continue calcium, vitamin D  and weightbearing exercise.  Follow up: 1 year for follow-up and follow-up hypertension and chronic problems Orders Placed This Encounter  Procedures   DG Bone Density   Flu vaccine HIGH DOSE PF(Fluzone Trivalent)   TSH   VITAMIN D  25 Hydroxy (Vit-D Deficiency, Fractures)   CBC with Differential/Platelet   Comprehensive metabolic panel with GFR   Lipid panel   Vitamin B12   No orders of the defined types were placed in this encounter.     Body mass index is 28.69 kg/m. Wt Readings from Last 3 Encounters:  06/11/24 172 lb 6.4 oz (78.2 kg)  03/12/24 170 lb (77.1 kg)  02/22/24 170 lb (77.1 kg)     Patient Active Problem List   Diagnosis Date Noted   Osteopenia after menopause 09/09/2022    Dexa 09/2022: lowest T  = - 2.3 at spine; osteopenia; usual recs and recheck in  2 years.     Vitamin B12 deficiency 09/01/2021   Long-term current use of proton pump inhibitor therapy 09/01/2021   Tubular adenoma 01/14/2020    Dr. Teressa, colonoscopy 2010;  2022 tubular adenoma and serrated polyps; recall in 2025    Essential hypertension 09/18/2018   GERD without esophagitis  09/18/2018   Family history of colon cancer 09/16/2017    Maternal aunt; has personal history of sessile serrated tubular adenoma Colonoscopy 2025 tics only, otherwise normal. Repeat 5 years: 2030    Family history of malignant melanoma 09/16/2017   Family history of thyroid  cancer 09/16/2017    Mother    Family history of breast cancer in mother    Health Maintenance  Topic Date Due   Medicare Annual Wellness (AWV)  06/30/2023   Influenza Vaccine  05/04/2024   COVID-19 Vaccine (4 - 2025-26 season) 06/27/2024 (Originally 06/04/2024)   DEXA SCAN  08/31/2024   MAMMOGRAM  03/09/2025   DTaP/Tdap/Td (2 - Td or Tdap) 06/28/2027   Colonoscopy  03/12/2029   Pneumococcal Vaccine: 50+ Years  Completed   Hepatitis C Screening  Completed   Zoster Vaccines- Shingrix   Completed   HPV VACCINES  Aged Out   Meningococcal B Vaccine  Aged Out   Immunization History  Administered Date(s) Administered   Fluad Quad(high Dose 65+) 09/01/2021   Fluad Trivalent(High Dose 65+) 06/08/2023   Influenza,inj,Quad PF,6+ Mos 10/14/2022   Influenza-Unspecified 07/01/2017, 06/27/2019, 08/04/2020   PFIZER(Purple Top)SARS-COV-2 Vaccination 09/30/2019, 10/22/2019, 09/03/2020   PNEUMOCOCCAL CONJUGATE-20 09/01/2021   Tdap 06/27/2017   Zoster Recombinant(Shingrix ) 09/18/2018, 01/14/2020   We updated and reviewed the patient's past history in detail and it is documented below. Allergies: Patient has no known allergies. Past Medical History Patient  has a past medical history of Allergy, Family history of breast cancer in mother, Family history of colon cancer (09/16/2017), GERD (gastroesophageal reflux disease), History of chicken pox, History of tear of ACL (anterior cruciate ligament), and Hypertension. Past Surgical History Patient  has a past surgical history that includes Knee arthroscopy with anterior cruciate ligament (acl) repair (Left, 2005); Breast surgery (Left); Tonsillectomy (1961); Wisdom tooth  extraction (Bilateral); Breast excisional biopsy (Left, 2008); Colonoscopy (08/2009-DJ); and Polypectomy. Family History: Patient family history includes Breast cancer in her maternal grandmother and mother; Cancer in her mother; Colon cancer in her maternal aunt; Colon polyps in her maternal aunt; Diabetes in her father; Healthy in her daughter, daughter, sister, sister, sister, and son; Heart disease in her father; Hypertension in her father; Melanoma in her mother; Myasthenia gravis in her father; Thyroid  cancer in her mother. Social History:  Patient  reports that she has quit smoking. Her smoking use included cigarettes. She has a 1.5 pack-year smoking history. She has never used smokeless tobacco. She reports current alcohol use of about 14.0 standard drinks of alcohol per week. She reports that she does not use drugs.  Review of Systems: Constitutional: negative for fever or malaise Ophthalmic: negative for photophobia, double vision or loss of vision Cardiovascular: negative for chest pain, dyspnea on exertion, or new LE swelling Respiratory: negative for SOB or persistent cough Gastrointestinal: negative for abdominal pain, change in bowel habits or melena Genitourinary: negative for dysuria or gross hematuria, no abnormal uterine bleeding or disharge Musculoskeletal: negative for new gait disturbance or muscular weakness Integumentary: negative for new or persistent rashes, no breast lumps Neurological: negative for TIA or stroke symptoms Psychiatric: negative for SI or delusions Allergic/Immunologic: negative for hives  Patient Care Team  Relationship Specialty Notifications Start End  Jodie Lavern CROME, MD PCP - General Family Medicine  09/16/17   Teressa Toribio SQUIBB, MD (Inactive) Consulting Physician Gastroenterology  01/14/20     Objective  Vitals: BP 125/79   Pulse 76   Temp 97.7 F (36.5 C)   Ht 5' 5 (1.651 m)   Wt 172 lb 6.4 oz (78.2 kg)   SpO2 98%   BMI 28.69 kg/m   General:  Well developed, well nourished, no acute distress  Psych:  Alert and orientedx3,normal mood and affect HEENT:  Normocephalic, atraumatic, non-icteric sclera,  supple neck without adenopathy, mass or thyromegaly Cardiovascular:  Normal S1, S2, RRR without gallop, rub or murmur Respiratory:  Good breath sounds bilaterally, CTAB with normal respiratory effort Gastrointestinal: normal bowel sounds, soft, non-tender, no noted masses. No HSM MSK: extremities without edema, joints without erythema or swelling Neurologic:    Mental status is normal.  Gross motor and sensory exams are normal.  No tremor  Commons side effects, risks, benefits, and alternatives for medications and treatment plan prescribed today were discussed, and the patient expressed understanding of the given instructions. Patient is instructed to call or message via MyChart if he/she has any questions or concerns regarding our treatment plan. No barriers to understanding were identified. We discussed Red Flag symptoms and signs in detail. Patient expressed understanding regarding what to do in case of urgent or emergency type symptoms.  Medication list was reconciled, printed and provided to the patient in AVS. Patient instructions and summary information was reviewed with the patient as documented in the AVS. This note was prepared with assistance of Dragon voice recognition software. Occasional wrong-word or sound-a-like substitutions may have occurred due to the inherent limitations of voice recognition software

## 2024-06-11 NOTE — Patient Instructions (Signed)
 Please return in 12 months for your annual complete physical; please come fasting. For follow up on chronic medical conditions   I will release your lab results to you on your MyChart account with further instructions. You may see the results before I do, but when I review them I will send you a message with my report or have my assistant call you if things need to be discussed. Please reply to my message with any questions. Thank you!   If you have any questions or concerns, please don't hesitate to send me a message via MyChart or call the office at [phone]. Thank you for visiting with us  today! It's our pleasure caring for you.   Please call the office checked below to schedule your appointment for your mammogram and/or bone density screen (the checked studies were ordered): []   Mammogram  [x]   Bone Density  [x]   [facility]     [address] [place], [place]        [phone]         []   [facility]  [address] [place], [place]  [phone]   Preventive Care 65 Years and Older, Female Preventive care refers to lifestyle choices and visits with your health care provider that can promote health and wellness. Preventive care visits are also called wellness exams. What can I expect for my preventive care visit? Counseling Your health care provider may ask you questions about your: Medical history, including: Past medical problems. Family medical history. Pregnancy and menstrual history. History of falls. Current health, including: Memory and ability to understand (cognition). Emotional well-being. Home life and relationship well-being. Sexual activity and sexual health. Lifestyle, including: Alcohol, nicotine or tobacco, and drug use. Access to firearms. Diet, exercise, and sleep habits. Work and work Astronomer. Sunscreen use. Safety issues such as seatbelt and bike helmet use. Physical exam Your health care provider  will check your: Height and weight. These may be used to calculate your BMI (body mass index). BMI is a measurement that tells if you are at a healthy weight. Waist circumference. This measures the distance around your waistline. This measurement also tells if you are at a healthy weight and may help predict your risk of certain diseases, such as type 2 diabetes and high blood pressure. Heart rate and blood pressure. Body temperature. Skin for abnormal spots. What immunizations do I need?  Vaccines are usually given at various ages, according to a schedule. Your health care provider will recommend vaccines for you based on your age, medical history, and lifestyle or other factors, such as travel or where you work. What tests do I need? Screening Your health care provider may recommend screening tests for certain conditions. This may include: Lipid and cholesterol levels. Hepatitis C test. Hepatitis B test. HIV (human immunodeficiency virus) test. STI (sexually transmitted infection) testing, if you are at risk. Lung cancer screening. Colorectal cancer screening. Diabetes screening. This is done by checking your blood sugar (glucose) after you have not eaten for a while (fasting). Mammogram. Talk with your health care provider about how often you should have regular mammograms. BRCA-related cancer screening. This may be done if you have a family history of breast, ovarian, tubal, or peritoneal cancers. Bone density scan. This is done to screen for osteoporosis. Talk with your health care provider about your test results, treatment options, and if necessary, the need for more tests. Follow these instructions at  home: Eating and drinking  Eat a diet that includes fresh fruits and vegetables, whole grains, lean protein, and low-fat dairy products. Limit your intake of foods with high amounts of sugar, saturated fats, and salt. Take vitamin and mineral supplements as recommended by your health  care provider. Do not drink alcohol if your health care provider tells you not to drink. If you drink alcohol: Limit how much you have to 0-1 drink a day. Know how much alcohol is in your drink. In the [place] one drink equals one 12 oz bottle of beer (355 mL), one 5 oz glass of wine (148 mL), or one 1 oz glass of hard liquor (44 mL). Lifestyle Brush your teeth every morning and night with fluoride toothpaste. Floss one time each day. Exercise for at least 30 minutes 5 or more days each week. Do not use any products that contain nicotine or tobacco. These products include cigarettes, chewing tobacco, and vaping devices, such as e-cigarettes. If you need help quitting, ask your health care provider. Do not use drugs. If you are sexually active, practice safe sex. Use a condom or other form of protection in order to prevent STIs. Take aspirin only as told by your health care provider. Make sure that you understand how much to take and what form to take. Work with your health care provider to find out whether it is safe and beneficial for you to take aspirin daily. Ask your health care provider if you need to take a cholesterol-lowering medicine (statin). Find healthy ways to manage stress, such as: Meditation, yoga, or listening to music. Journaling. Talking to a trusted person. Spending time with friends and family. Minimize exposure to UV radiation to reduce your risk of skin cancer. Safety Always wear your seat belt while driving or riding in a vehicle. Do not drive: If you have been drinking alcohol. Do not ride with someone who has been drinking. When you are tired or distracted. While texting. If you have been using any mind-altering substances or drugs. Wear a helmet and other protective equipment during sports activities. If you have firearms in your house, make sure you follow all gun safety procedures. What's next? Visit your health care provider once a year for an annual wellness  visit. Ask your health care provider how often you should have your eyes and teeth checked. Stay up to date on all vaccines. This information is not intended to replace advice given to you by your health care provider. Make sure you discuss any questions you have with your health care provider. Document Revised: 03/18/2021 Document Reviewed: 03/18/2021 Elsevier Patient Education  2024 [employer].

## 2024-06-14 ENCOUNTER — Ambulatory Visit: Payer: Self-pay | Admitting: Family

## 2024-06-15 ENCOUNTER — Other Ambulatory Visit (HOSPITAL_BASED_OUTPATIENT_CLINIC_OR_DEPARTMENT_OTHER): Payer: Self-pay

## 2024-07-03 ENCOUNTER — Encounter: Payer: Self-pay | Admitting: Pharmacist

## 2024-07-03 NOTE — Progress Notes (Signed)
 Pharmacy Quality Measure Review  This patient is appearing on a report for being at risk of failing the adherence measure for hypertension (ACEi/ARB) medications this calendar year.   Medication: lisinopril -HCT Last fill date: 06/24/2024 for 90 day supply  Other fills in 2025 - 90 day supply on 11/04/2023 and 02/28/2024 Patient has 1 refill remaining. Next appt with PCP in 09/20226.  Insurance report was not up to date. No action needed at this time.   Madelin Ray, PharmD Clinical Pharmacist [facility]  Population Health [phone]

## 2024-08-06 ENCOUNTER — Encounter: Payer: Self-pay | Admitting: Radiology

## 2025-01-22 ENCOUNTER — Ambulatory Visit (HOSPITAL_BASED_OUTPATIENT_CLINIC_OR_DEPARTMENT_OTHER)

## 2025-06-13 ENCOUNTER — Encounter: Admitting: Family Medicine
